# Patient Record
Sex: Male | Born: 1995 | Race: White | Hispanic: No | Marital: Single | Smoking: Former smoker
Health system: Southern US, Community
[De-identification: ages and names within clinical notes are randomized; demographics above are authoritative.]

## PROBLEM LIST (undated history)

## (undated) DIAGNOSIS — F32A Depression, unspecified: Secondary | ICD-10-CM

## (undated) DIAGNOSIS — F319 Bipolar disorder, unspecified: Secondary | ICD-10-CM

## (undated) DIAGNOSIS — F329 Major depressive disorder, single episode, unspecified: Secondary | ICD-10-CM

## (undated) HISTORY — PX: TONSILLECTOMY, ADENOIDECTOMY: SHX5620

## (undated) HISTORY — DX: Depression, unspecified: F32.A

## (undated) HISTORY — DX: Major depressive disorder, single episode, unspecified: F32.9

## (undated) HISTORY — DX: Bipolar disorder, unspecified: F31.9

---
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
## 2016-10-09 ENCOUNTER — Emergency Department
Admission: EM | Admit: 2016-10-09 | Discharge: 2016-10-09 | Disposition: A | Discharge status: Home / Self Care | Payer: 59 | Attending: Emergency Medicine | Admitting: Emergency Medicine

## 2016-10-09 ENCOUNTER — Emergency Department: Payer: 59

## 2016-10-09 DIAGNOSIS — Y9289 Other specified places as the place of occurrence of the external cause: Secondary | ICD-10-CM | POA: Insufficient documentation

## 2016-10-09 DIAGNOSIS — Y999 Unspecified external cause status: Secondary | ICD-10-CM | POA: Diagnosis not present

## 2016-10-09 DIAGNOSIS — Y939 Activity, unspecified: Secondary | ICD-10-CM | POA: Diagnosis not present

## 2016-10-09 DIAGNOSIS — S060X0A Concussion without loss of consciousness, initial encounter: Secondary | ICD-10-CM | POA: Diagnosis not present

## 2016-10-09 DIAGNOSIS — S0990XA Unspecified injury of head, initial encounter: Secondary | ICD-10-CM | POA: Diagnosis present

## 2016-10-09 MED ORDER — IBUPROFEN 800 MG PO TABS
800.0000 mg | ORAL_TABLET | Freq: Once | ORAL | Status: AC
Start: 2016-10-09 — End: 2016-10-09
  Administered 2016-10-09: 800 mg via ORAL
  Filled 2016-10-09: qty 1

## 2016-10-09 NOTE — ED Notes (Signed)
Patient transported to CT 
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
## 2016-10-09 NOTE — ED Notes (Signed)
Pt. Going home with friend 

## 2016-10-13 DIAGNOSIS — F0781 Postconcussional syndrome: Secondary | ICD-10-CM | POA: Insufficient documentation

## 2016-10-13 DIAGNOSIS — G44319 Acute post-traumatic headache, not intractable: Secondary | ICD-10-CM | POA: Insufficient documentation

## 2016-11-08 ENCOUNTER — Ambulatory Visit (INDEPENDENT_AMBULATORY_CARE_PROVIDER_SITE_OTHER): Payer: No Typology Code available for payment source | Admitting: Clinical Neuropsychologist

## 2016-11-08 DIAGNOSIS — S060X1A Concussion with loss of consciousness of 30 minutes or less, initial encounter: Secondary | ICD-10-CM | POA: Insufficient documentation
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
## 2018-03-01 ENCOUNTER — Emergency Department
Admission: EM | Admit: 2018-03-01 | Discharge: 2018-03-02 | Disposition: A | Discharge status: Other Acute Inpatient Hospital | Payer: 59 | Attending: Emergency Medicine | Admitting: Emergency Medicine

## 2018-03-01 DIAGNOSIS — F312 Bipolar disorder, current episode manic severe with psychotic features: Secondary | ICD-10-CM

## 2018-03-01 DIAGNOSIS — F3113 Bipolar disorder, current episode manic without psychotic features, severe: Secondary | ICD-10-CM | POA: Diagnosis not present

## 2018-03-01 DIAGNOSIS — F29 Unspecified psychosis not due to a substance or known physiological condition: Secondary | ICD-10-CM | POA: Diagnosis not present

## 2018-03-01 DIAGNOSIS — F17210 Nicotine dependence, cigarettes, uncomplicated: Secondary | ICD-10-CM | POA: Diagnosis not present

## 2018-03-01 DIAGNOSIS — F901 Attention-deficit hyperactivity disorder, predominantly hyperactive type: Secondary | ICD-10-CM | POA: Insufficient documentation

## 2018-03-01 LAB — COMPREHENSIVE METABOLIC PANEL
ALBUMIN: 4.9 g/dL (ref 3.5–5.0)
ALK PHOS: 76 U/L (ref 38–126)
ALT: 65 U/L — ABNORMAL HIGH (ref 17–63)
AST: 116 U/L — ABNORMAL HIGH (ref 15–41)
Anion gap: 12 (ref 5–15)
BILIRUBIN TOTAL: 0.7 mg/dL (ref 0.3–1.2)
BUN: 11 mg/dL (ref 6–20)
CALCIUM: 9.1 mg/dL (ref 8.9–10.3)
CO2: 23 mmol/L (ref 22–32)
CREATININE: 0.86 mg/dL (ref 0.61–1.24)
Chloride: 103 mmol/L (ref 101–111)
GFR calc non Af Amer: >60 mL/min (ref >60)
GLUCOSE: 120 mg/dL — AB (ref 65–99)
Potassium: 3.9 mmol/L (ref 3.5–5.1)
SODIUM: 138 mmol/L (ref 135–145)
TOTAL PROTEIN: 8.7 g/dL — AB (ref 6.5–8.1)

## 2018-03-01 LAB — CBC
HEMATOCRIT: 46.6 % (ref 40.0–52.0)
Hemoglobin: 15.6 g/dL (ref 13.0–18.0)
MCH: 29.3 pg (ref 26.0–34.0)
MCHC: 33.3 g/dL (ref 32.0–36.0)
MCV: 87.8 fL (ref 80.0–100.0)
Platelets: 326 10*3/uL (ref 150–440)
RBC: 5.31 MIL/uL (ref 4.40–5.90)
RDW: 13.6 % (ref 11.5–14.5)
WBC: 11.1 10*3/uL — ABNORMAL HIGH (ref 3.8–10.6)

## 2018-03-01 LAB — ACETAMINOPHEN LEVEL

## 2018-03-01 LAB — ETHANOL: Alcohol, Ethyl (B): <10 mg/dL (ref <10)

## 2018-03-01 LAB — SALICYLATE LEVEL: Salicylate Lvl: <7 mg/dL (ref 2.8–30.0)

## 2018-03-01 MED ORDER — LORAZEPAM 2 MG/ML IJ SOLN: 2.0000 mg | Freq: Once | INTRAMUSCULAR | Status: DC

## 2018-03-01 MED ORDER — DIPHENHYDRAMINE HCL 50 MG/ML IJ SOLN: 50.0000 mg | Freq: Once | INTRAMUSCULAR | Status: DC

## 2018-03-01 MED ORDER — LORAZEPAM 2 MG/ML IJ SOLN
INTRAMUSCULAR | Status: AC
  Filled 2018-03-01: qty 1

## 2018-03-01 MED ORDER — HALOPERIDOL LACTATE 5 MG/ML IJ SOLN: 5.0000 mg | Freq: Once | INTRAMUSCULAR | Status: DC

## 2018-03-01 MED ORDER — HALOPERIDOL LACTATE 5 MG/ML IJ SOLN
INTRAMUSCULAR | Status: AC
  Filled 2018-03-01: qty 1

## 2018-03-01 MED ORDER — DIPHENHYDRAMINE HCL 50 MG/ML IJ SOLN
INTRAMUSCULAR | Status: AC
  Filled 2018-03-01: qty 1

## 2018-03-01 NOTE — ED Triage Notes (Signed)
Patient states: "I have a patent on a theorem that I would like to file that can prove anything that has ever happened, or will ever happen. Whenever I try to explain this to anyone, I am shuffled to a new person. I have given this patent to my father in [place], but instead I have been ushered around, preventing me from filing this patent."   Patient asking for written documentation that everything that this RN does is legal.

## 2018-03-01 NOTE — ED Notes (Signed)
MD Schaevitz to triage 1 to speak with patient

## 2018-03-01 NOTE — ED Triage Notes (Signed)
Patient reports he hasn't slept from between 1-35 hours.

## 2018-03-01 NOTE — ED Triage Notes (Signed)
Patient states: "4 standard unit measures of alcohol". Patient denies drug use.

## 2018-03-01 NOTE — ED Notes (Signed)
ODS aware of IVC

## 2018-03-01 NOTE — ED Provider Notes (Signed)
[facility] Emergency Department Provider Note  ___________________________________________   First MD Initiated Contact with Patient 03/01/18 2254     (approximate)  I have reviewed the triage vital signs and the nursing notes.   HISTORY  Chief Complaint No chief complaint on file.   HPI Verdell CarmineLachlan Mazzocco is a 22 y.o. male with a history of ADHD, unmedicated, who is presenting to the emergency department today with ill on PD who responding to a call to their crisis center from the patient's father.  Per the patient, he has discovered "a serum of everything."  He says that he try to get a patent on it today and that he thinks that there was a response from people who are after his passion.  He said that several men in black suit stood "equidistant" from each other around him at a bar today.  He also repeatedly says that he thinks he has been "shuffleboarded" when referring to his current situation.  Unclear what this means exactly.  He gave me permission to speak with his father and his father said that he is not normally acting like this.  The father says that he has had multiple text messages from the patient throughout the day talking about his "the arm of everything" and getting a patent on this.  Father says that there is a history of mental illness in the family with a grandfather with bipolar disorder as well as the patient's sister who killed herself 7 years ago.  The father says that the patient refuses to medicate himself for his ADHD.  Patient does admit to having several beers today but otherwise denies any drug use.   History reviewed. No pertinent past medical history.  There are no active problems to display for this patient.   History reviewed. No pertinent surgical history.  Prior to Admission medications   Not on File    Allergies Patient has no known allergies.  No family history on file.  Social History Social History   Tobacco Use    . Smoking status: Current Some Day Smoker  . Smokeless tobacco: Never Used  Substance Use Topics  . Alcohol use: Yes  . Drug use: Not on file    Review of Systems  Constitutional: No fever/chills Eyes: No visual changes. ENT: No sore throat. Cardiovascular: Denies chest pain. Respiratory: Denies shortness of breath. Gastrointestinal: No abdominal pain.  No nausea, no vomiting.  No diarrhea.  No constipation. Genitourinary: Negative for dysuria. Musculoskeletal: Negative for back pain. Skin: Negative for rash. Neurological: Negative for headaches, focal weakness or numbness.   ____________________________________________   PHYSICAL EXAM:  VITAL SIGNS: ED Triage Vitals [03/01/18 2212]  Enc Vitals Group     BP (!) 169/113     Pulse Rate 94     Resp 18     Temp 98.4 F (36.9 C)     Temp Source Oral     SpO2 99 %     Weight 230 lb (104.3 kg)     Height 6' (1.829 m)     Head Circumference      Peak Flow      Pain Score      Pain Loc      Pain Edu?      Excl. in GC?     Constitutional: Alert and oriented.  Patient appears agitated and slightly confrontational.  Standing up and pacing around the room while talking to me.  However, he is redirectable and agrees to  be dressed out into scrubs and to talk to psychiatry to have his blood drawn. Eyes: Conjunctivae are normal.  Head: Atraumatic. Nose: No congestion/rhinnorhea. Mouth/Throat: Mucous membranes are moist.  Neck: No stridor.   Cardiovascular: Normal rate, regular rhythm. Grossly normal heart sounds.   Respiratory: Normal respiratory effort.  No retractions. Lungs CTAB. Gastrointestinal: Soft and nontender. No distention. Musculoskeletal: No lower extremity tenderness nor edema.  No joint effusions. Neurologic:  Normal speech and language. No gross focal neurologic deficits are appreciated. Skin:  Skin is warm, dry and intact. No rash noted. Psychiatric: Delusions of grandeur with tangential  speech.  ____________________________________________   LABS (all labs ordered are listed, but only abnormal results are displayed)  Labs Reviewed  COMPREHENSIVE METABOLIC PANEL - Abnormal; Notable for the following components:      Result Value   Glucose, Bld 120 (*)    Total Protein 8.7 (*)    AST 116 (*)    ALT 65 (*)    All other components within normal limits  CBC - Abnormal; Notable for the following components:   WBC 11.1 (*)    All other components within normal limits  ETHANOL  SALICYLATE LEVEL  ACETAMINOPHEN LEVEL  URINE DRUG SCREEN, QUALITATIVE ([facility] ONLY)  URINALYSIS, COMPLETE ([facility]) WITH MICROSCOPIC   ____________________________________________  EKG   ____________________________________________  RADIOLOGY   ____________________________________________   PROCEDURES  Procedure(s) performed:   Procedures  Critical Care performed:   ____________________________________________   INITIAL IMPRESSION / ASSESSMENT AND PLAN / ED COURSE  Pertinent labs & imaging results that were available during my care of the patient were reviewed by me and considered in my medical decision making (see chart for details).  DDX: Psychosis, mania, schizophrenia, drug-induced psychosis, alcohol intoxication As part of my medical decision making, I reviewed the following data within the electronic MEDICAL RECORD NUMBER No prior visits for review.   Patient placed under involuntary commitment and will be evaluated by psychiatry. ____________________________________________   FINAL CLINICAL IMPRESSION(S) / ED DIAGNOSES  Psychosis    NEW MEDICATIONS STARTED DURING THIS VISIT:  New Prescriptions   No medications on file     Note:  This document was prepared using Dragon voice recognition software and may include unintentional dictation errors.     Myrna Blazer, MD 03/01/18 [phone]

## 2018-03-01 NOTE — ED Notes (Signed)
Patient's father, Lacie Scottsndrew J Farfan: [phone].   Patient does NOT want his medical information shared with his father at this point.

## 2018-03-02 ENCOUNTER — Other Ambulatory Visit: Payer: Self-pay

## 2018-03-02 ENCOUNTER — Inpatient Hospital Stay
Admission: AD | Admit: 2018-03-02 | Discharge: 2018-03-09 | DRG: 885 | Disposition: A | Discharge status: Home / Self Care | Payer: 59 | Attending: Psychiatry | Admitting: Psychiatry

## 2018-03-02 DIAGNOSIS — F17200 Nicotine dependence, unspecified, uncomplicated: Secondary | ICD-10-CM | POA: Diagnosis present

## 2018-03-02 DIAGNOSIS — Z5181 Encounter for therapeutic drug level monitoring: Secondary | ICD-10-CM | POA: Diagnosis not present

## 2018-03-02 DIAGNOSIS — E8881 Metabolic syndrome: Secondary | ICD-10-CM | POA: Diagnosis present

## 2018-03-02 DIAGNOSIS — F3113 Bipolar disorder, current episode manic without psychotic features, severe: Secondary | ICD-10-CM

## 2018-03-02 DIAGNOSIS — F29 Unspecified psychosis not due to a substance or known physiological condition: Secondary | ICD-10-CM | POA: Diagnosis not present

## 2018-03-02 DIAGNOSIS — F312 Bipolar disorder, current episode manic severe with psychotic features: Principal | ICD-10-CM | POA: Diagnosis present

## 2018-03-02 DIAGNOSIS — F1010 Alcohol abuse, uncomplicated: Secondary | ICD-10-CM | POA: Diagnosis present

## 2018-03-02 DIAGNOSIS — F909 Attention-deficit hyperactivity disorder, unspecified type: Secondary | ICD-10-CM | POA: Diagnosis present

## 2018-03-02 DIAGNOSIS — Z818 Family history of other mental and behavioral disorders: Secondary | ICD-10-CM

## 2018-03-02 DIAGNOSIS — G4700 Insomnia, unspecified: Secondary | ICD-10-CM | POA: Diagnosis not present

## 2018-03-02 DIAGNOSIS — Z79899 Other long term (current) drug therapy: Secondary | ICD-10-CM | POA: Diagnosis not present

## 2018-03-02 LAB — URINE DRUG SCREEN, QUALITATIVE (ARMC ONLY)
Amphetamines, Ur Screen: NOT DETECTED
BENZODIAZEPINE, UR SCRN: NOT DETECTED
Barbiturates, Ur Screen: NOT DETECTED
Cannabinoid 50 Ng, Ur ~~LOC~~: NOT DETECTED
Cocaine Metabolite,Ur ~~LOC~~: NOT DETECTED
MDMA (Ecstasy)Ur Screen: NOT DETECTED
METHADONE SCREEN, URINE: NOT DETECTED
Opiate, Ur Screen: NOT DETECTED
Phencyclidine (PCP) Ur S: NOT DETECTED
TRICYCLIC, UR SCREEN: NOT DETECTED

## 2018-03-02 LAB — URINALYSIS, COMPLETE (UACMP) WITH MICROSCOPIC
BILIRUBIN URINE: NEGATIVE
Bacteria, UA: NONE SEEN
Glucose, UA: NEGATIVE mg/dL
Hgb urine dipstick: NEGATIVE
Ketones, ur: 5 mg/dL — AB
LEUKOCYTES UA: NEGATIVE
NITRITE: NEGATIVE
PH: 5 (ref 5.0–8.0)
Protein, ur: NEGATIVE mg/dL
SPECIFIC GRAVITY, URINE: 1.02 (ref 1.005–1.030)

## 2018-03-02 MED ORDER — LITHIUM CARBONATE ER 300 MG PO TBCR
600.0000 mg | EXTENDED_RELEASE_TABLET | Freq: Every day | ORAL | Status: DC
  Administered 2018-03-02: 600 mg via ORAL
  Filled 2018-03-02: qty 2

## 2018-03-02 MED ORDER — LORAZEPAM 2 MG PO TABS
2.0000 mg | ORAL_TABLET | Freq: Four times a day (QID) | ORAL | Status: DC | PRN
  Administered 2018-03-02: 2 mg via ORAL
  Filled 2018-03-02: qty 1

## 2018-03-02 MED ORDER — TEMAZEPAM 15 MG PO CAPS
15.0000 mg | ORAL_CAPSULE | Freq: Every evening | ORAL | Status: DC | PRN
  Administered 2018-03-02 – 2018-03-03 (×2): 15 mg via ORAL
  Filled 2018-03-02 (×2): qty 1

## 2018-03-02 MED ORDER — LORAZEPAM 2 MG PO TABS: 2.0000 mg | ORAL_TABLET | Freq: Four times a day (QID) | ORAL | Status: DC | PRN

## 2018-03-02 MED ORDER — HYDROXYZINE HCL 50 MG PO TABS
50.0000 mg | ORAL_TABLET | Freq: Four times a day (QID) | ORAL | Status: DC | PRN
  Administered 2018-03-05 – 2018-03-07 (×3): 50 mg via ORAL
  Filled 2018-03-02 (×4): qty 1

## 2018-03-02 MED ORDER — LITHIUM CARBONATE ER 300 MG PO TBCR: 600.0000 mg | EXTENDED_RELEASE_TABLET | Freq: Every day | ORAL | Status: DC

## 2018-03-02 MED ORDER — MAGNESIUM HYDROXIDE 400 MG/5ML PO SUSP: 30.0000 mL | Freq: Every day | ORAL | Status: DC | PRN

## 2018-03-02 MED ORDER — ALUM & MAG HYDROXIDE-SIMETH 200-200-20 MG/5ML PO SUSP: 30.0000 mL | ORAL | Status: DC | PRN

## 2018-03-02 MED ORDER — ACETAMINOPHEN 325 MG PO TABS: 650.0000 mg | ORAL_TABLET | Freq: Four times a day (QID) | ORAL | Status: DC | PRN

## 2018-03-02 NOTE — ED Notes (Signed)
Report to include Situation, Background, Assessment, and Recommendations received from Kenisha RN. Patient alert and oriented, warm and dry, in no acute distress. Patient denies SI, HI, AVH and pain. Patient made aware of Q15 minute rounds and security cameras for their safety. Patient instructed to come to me with needs or concerns. 

## 2018-03-02 NOTE — ED Notes (Signed)
Breakfast meal tray provided, as well as toiletries provided, and patient performed ADLs independently without any acute distress noted.

## 2018-03-02 NOTE — Progress Notes (Signed)
[facility] , alert and oriented x4 , responding and participating in assessment procedures. Patient present with disturbed perceptions including dilutions, states he has  discovered how humans can   travel in black holes and back and ask me if I can help him get a patent, he continued by inventing a time travel machine that overcome the earths  Gravity on humans and makes it possible to travel in warm holes. 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 Patient behaviors is incongruent with current situation, thinking is disorganized and abnormal. Skin and body search was done by 2 RNs , Alex/Alexis. No contraband was found. Patient is compliant with his medication, contracted for safety of self and others , denies any suicidal tendencies and H/I , patient complies intermittently with safety rules, 15 minutes safety rounds maintained, no acute distress.

## 2018-03-02 NOTE — BH Assessment (Signed)
[facility] by Dr. Toni Amendlapacs.  Attending Physician will be Dr. Jennet MaduroPucilowska.   Patient has been [facility] to room 323, [facility] PocaPhyllis.   ER staff is aware of the admission:  Annette,ER Sectary   Dr. Alphonzo LemmingsMcShane, ER MD   Everardo PacificKenisha, Patient's Nurse   Mertie ClauseJeanelle, Patient Access

## 2018-03-02 NOTE — ED Notes (Signed)
SOC in progress.  

## 2018-03-02 NOTE — ED Notes (Signed)
Patient indicated he was aware that he mother was driving to hospital, patient wants mother to know about his medical condition and offer hx if needed.

## 2018-03-02 NOTE — ED Notes (Signed)
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

## 2018-03-02 NOTE — ED Notes (Signed)
SOC recommends inpatient. Will be faxing recommendations.

## 2018-03-02 NOTE — ED Notes (Addendum)
Front desk called and indicated mother was here.  Mother indicated patient has had multiple stressors in the past couple months.  Mother left phone #[phone]

## 2018-03-02 NOTE — Tx Team (Signed)
Initial Treatment Plan 03/02/2018 10:39 PM Brandon CarmineLachlan Ivins JYN:[id number]    PATIENT STRESSORS: Health problems Occupational concerns Substance abuse   PATIENT STRENGTHS: Average or above average intelligence Communication skills General fund of knowledge Motivation for treatment/growth Physical Health Supportive family/friends   PATIENT IDENTIFIED PROBLEMS:   Psychosis Drug Induced    Depression/anxiety    ADHD    Dilutions of grandeur       DISCHARGE CRITERIA:  Ability to meet basic life and health needs Adequate post-discharge living arrangements Improved stabilization in mood, thinking, and/or behavior Motivation to continue treatment in a less acute level of care Reduction of life-threatening or endangering symptoms to within safe limits Safe-care adequate arrangements made  PRELIMINARY DISCHARGE PLAN: Attend aftercare/continuing care group Outpatient therapy Participate in family therapy Placement in alternative living arrangements Return to previous living arrangement  PATIENT/FAMILY INVOLVEMENT: This treatment plan has been presented to and reviewed with the patient, Brandon Orr,  The patient  have been given the opportunity to ask questions and make suggestions.  Lelan PonsAlexander  Kongmeng Santoro, RN 03/02/2018, 10:39 PM

## 2018-03-02 NOTE — ED Notes (Signed)
[facility]

## 2018-03-02 NOTE — [facility] Group Notes (Signed)
[facility] Group Notes:  (Nursing/MHT/Case Management/Adjunct)  Date:  03/02/2018  Time:  9:38 PM  Type of Therapy:  Group Therapy  Participation Level:  Did Not Attend   Summary of Progress/Problems:  Mayra NeerJackie L Kamarri Lovvorn 03/02/2018, 9:38 PM

## 2018-03-02 NOTE — ED Notes (Signed)
Report given to SOC 

## 2018-03-02 NOTE — ED Provider Notes (Signed)
-----------------------------------------   7:13 AM on 03/02/2018 -----------------------------------------   Blood pressure (!) 126/93, pulse 94, temperature 98.4 F (36.9 C), temperature source Oral, resp. rate 18, height 6' (1.829 m), weight 104.3 kg (230 lb), SpO2 99 %.  The patient had no acute events since last update.  Calm and cooperative at this time.  Disposition is pending Psychiatry/Behavioral Medicine team recommendations.     Jeanmarie PlantMcShane, Jasman Pfeifle A, MD 03/02/18 [phone]

## 2018-03-02 NOTE — ED Notes (Signed)
Pt. To BHU from ED ambulatory without difficulty, to room  B[facility] room 4. [facility]. Pt. Is alert and oriented, warm and dry in no distress. Pt. Denies SI, HI, and AVH. Pt. Calm and cooperative. Pt. Made aware of security cameras and Q15 minute rounds. Pt. Encouraged to let Nursing staff know of any concerns or needs.

## 2018-03-02 NOTE — Consult Note (Signed)
[place] Psychiatry Consult   Reason for Consult: Consult for this 22 year old man brought over by authorities because of manic behavior Referring Physician: Reita Cliche Patient Identification: Brandon Orr MRN:  [id number] Principal Diagnosis: Bipolar disorder with severe mania ([place]) Diagnosis:   Patient Active Problem List   Diagnosis Date Noted  . Bipolar disorder with severe mania ([place]) [F31.13] 03/02/2018    Total Time spent with patient: 1 hour  Subjective:   Brandon Orr is a 22 y.o. male patient admitted with "yesterday I discovered a formula".  HPI: Patient interviewed.  Chart reviewed.  Spoke with his mother as well who came down and is here in person.  22 year old man brought in because of acute manic behaviors.  Patient says that yesterday he realized that he had discovered a formula that will somehow revolutionized history or science.  His description of it is incoherent and self contradictory.  He is obviously very emotionally agitated and labile.  It sounds like he then got paranoid about it and started acting paranoid and speaking on his psychotic thoughts in public 2 people at a lacrosse game and then at a [employer].  Patient is only partially cooperative with the interview.  Will not detail exactly how much she is been sleeping.  He admits that he had "a 6 pack" to drink yesterday but will not go into much detail about how frequently or how much alcohol he is drinking most of the time.  Denies that he has been using any other drugs.  Talking to his mother there are hints of major life stresses and possibly some emotional lability recently but nothing that had been seen as pathologic by his family.  Patient had gone through an experience last semester through January of battling against a charge of some kind of sexual misconduct.  Also recently broke up with his girlfriend.  Mother does mention that he had seemed angry recently but that it had seemed appropriate given his  stress situation.  Patient is not currently taking any psychiatric medicine.  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  Family lives in [place].  Patient went through a difficult time last semester having been accused of some kind of inappropriate sexual behavior.  According to all parties he was eventually cleared of this but it caused some major problems to his social and schooling development.  Substance abuse history: Patient says that he drinks alcohol and had "a 6 pack" to drink yesterday but will not go into detail about how much he drinks most of the time.  It does not sound like there is ever been concerned about substance use problems in the past  Past Psychiatric History: Patient was diagnosed and treated for ADHD as a child.  Was on stimulant medicine many years ago.  He says he has not taken it in at least many months.  He admits that he has seen mental health providers but is a bit paranoid about it and declines to give me much detail.  He tells me that somebody had prescribed some medicine for him but he is not taking it.  Declines to tell me what it is.  According to the reconciliation there is some evidence that Wellbutrin was prescribed at some point.  Denies any history of suicide attempts.  Risk to Self: Suicidal Ideation: No Suicidal Intent: No Is patient at risk for suicide?: No Suicidal Plan?: No Access  to Means: No What has been your use of drugs/alcohol within the last 12 months?: pt reports drinking a few beers How many times?: 0 Other Self Harm Risks: none noted Triggers for Past Attempts: Other (Comment) Intentional Self Injurious Behavior: None Risk to Others: Homicidal Ideation: No Thoughts of Harm to Others: No Current Homicidal Intent: No Current Homicidal Plan: No Access to Homicidal Means: No Identified Victim: n/a History of harm to others?:  No Assessment of Violence: None Noted Violent Behavior Description: denies Does patient have access to weapons?: No Criminal Charges Pending?: No Does patient have a court date: No Prior Inpatient Therapy: Prior Inpatient Therapy: No Prior Outpatient Therapy: Prior Outpatient Therapy: No Does patient have an ACCT team?: No Does patient have Intensive In-House Services?  : No Does patient have [facility] services? : No Does patient have P4CC services?: No  Past Medical History: History reviewed. No pertinent past medical history. History reviewed. No pertinent surgical history. Family History: No family history on file. Family Psychiatric  History: Significant family history.  Patient's older sister killed herself several years ago.  Underlying pathology unknown.  He had a biological grandfather who suffered from bipolar disorder and was on lithium for years. Social History:  Social History   Substance and Sexual Activity  Alcohol Use Yes     Social History   Substance and Sexual Activity  Drug Use Not on file    Social History   Socioeconomic History  . Marital status: Single    Spouse name: None  . Number of children: None  . Years of education: None  . Highest education level: None  Social Needs  . Financial resource strain: None  . Food insecurity - worry: None  . Food insecurity - inability: None  . Transportation needs - medical: None  . Transportation needs - non-medical: None  Occupational History  . None  Tobacco Use  . Smoking status: Current Some Day Smoker  . Smokeless tobacco: Never Used  Substance and Sexual Activity  . Alcohol use: Yes  . Drug use: None  . Sexual activity: None  Other Topics Concern  . None  Social History Narrative  . None   Additional Social History:    Allergies:  No Known Allergies  Labs:  Results for orders placed or performed during the hospital encounter of 03/01/18 (from the past 48 hour(s))  Comprehensive metabolic  panel     Status: Abnormal   Collection Time: 03/01/18 10:36 PM  Result Value Ref Range   Sodium 138 135 - 145 mmol/L   Potassium 3.9 3.5 - 5.1 mmol/L   Chloride 103 101 - 111 mmol/L   CO2 23 22 - 32 mmol/L   Glucose, Bld 120 (H) 65 - 99 mg/dL   BUN 11 6 - 20 mg/dL   Creatinine, Ser 0.86 0.61 - 1.24 mg/dL   Calcium 9.1 8.9 - 10.3 mg/dL   Total Protein 8.7 (H) 6.5 - 8.1 g/dL   Albumin 4.9 3.5 - 5.0 g/dL   AST 116 (H) 15 - 41 U/L   ALT 65 (H) 17 - 63 U/L   Alkaline Phosphatase 76 38 - 126 U/L   Total Bilirubin 0.7 0.3 - 1.2 mg/dL   GFR calc non Af Amer >60 >60 mL/min   GFR calc Af Amer >60 >60 mL/min    Comment: (NOTE) The eGFR has been calculated using the CKD EPI equation. This calculation has not been validated in all clinical situations. eGFR's persistently <60 mL/min signify possible  Chronic Kidney Disease.    Anion gap 12 5 - 15    Comment: Performed at [facility], [address] [place], [place] [address]  Ethanol     Status: None   Collection Time: 03/01/18 10:36 PM  Result Value Ref Range   Alcohol, Ethyl (B) <10 <10 mg/dL    Comment:        LOWEST DETECTABLE LIMIT FOR SERUM ALCOHOL IS 10 mg/dL FOR MEDICAL PURPOSES ONLY Performed at [facility], [address] [place], [place] [address]   Salicylate level     Status: None   Collection Time: 03/01/18 10:36 PM  Result Value Ref Range   Salicylate Lvl <6.2 2.8 - 30.0 mg/dL    Comment: Performed at [facility], [address] [place], [place] [address]  Acetaminophen level     Status: Abnormal   Collection Time: 03/01/18 10:36 PM  Result Value Ref Range   Acetaminophen (Tylenol), Serum <10 (L) 10 - 30 ug/mL    Comment:        THERAPEUTIC CONCENTRATIONS VARY SIGNIFICANTLY. A RANGE OF 10-30 ug/mL MAY BE AN EFFECTIVE CONCENTRATION FOR MANY PATIENTS. HOWEVER, SOME ARE BEST TREATED AT CONCENTRATIONS OUTSIDE THIS RANGE. ACETAMINOPHEN CONCENTRATIONS >150 ug/mL AT 4 HOURS  AFTER INGESTION AND >50 ug/mL AT 12 HOURS AFTER INGESTION ARE OFTEN ASSOCIATED WITH TOXIC REACTIONS. Performed at [facility], [address] [place], [place] [address]   cbc     Status: Abnormal   Collection Time: 03/01/18 10:36 PM  Result Value Ref Range   WBC 11.1 (H) 3.8 - 10.6 K/uL   RBC 5.31 4.40 - 5.90 MIL/uL   Hemoglobin 15.6 13.0 - 18.0 g/dL   HCT 46.6 40.0 - 52.0 %   MCV 87.8 80.0 - 100.0 fL   MCH 29.3 26.0 - 34.0 pg   MCHC 33.3 32.0 - 36.0 g/dL   RDW 13.6 11.5 - 14.5 %   Platelets 326 150 - 440 K/uL    Comment: Performed at [facility], [address] [place], [place] [address]    Current Facility-Administered Medications  Medication Dose Route Frequency Provider Last Rate Last Dose  . diphenhydrAMINE (BENADRYL) injection 50 mg  50 mg Intramuscular Once Orbie Pyo, MD      . haloperidol lactate (HALDOL) injection 5 mg  5 mg Intramuscular Once Orbie Pyo, MD      . lithium carbonate (LITHOBID) CR tablet 600 mg  600 mg Oral QHS Ceciley Buist T, MD      . LORazepam (ATIVAN) injection 2 mg  2 mg Intramuscular Once Orbie Pyo, MD      . LORazepam (ATIVAN) tablet 2 mg  2 mg Oral Q6H PRN Jaret Coppedge, Madie Reno, MD       Current Outpatient Medications  Medication Sig Dispense Refill  . buPROPion (WELLBUTRIN XL) 150 MG 24 hr tablet Take 150 mg by mouth daily.      Musculoskeletal: Strength & Muscle Tone: within normal limits Gait & Station: normal Patient leans: N/A  Psychiatric Specialty Exam: Physical Exam  Nursing note and vitals reviewed. Constitutional: He appears well-developed and well-nourished.  HENT:  Head: Normocephalic and atraumatic.  Eyes: Conjunctivae are normal. Pupils are equal, round, and reactive to light.  Neck: Normal range of motion.  Cardiovascular: Regular rhythm and normal heart sounds.  Respiratory: Effort normal. No respiratory distress.  GI: Soft.  Musculoskeletal: Normal range  of motion.  Neurological: He is alert.  Skin: Skin is warm  and dry.  Psychiatric: His affect is labile and inappropriate. His speech is rapid and/or pressured and tangential. He is agitated and hyperactive. He is not aggressive and not combative. Thought content is paranoid and delusional. Cognition and memory are normal. He expresses impulsivity and inappropriate judgment.    Review of Systems  Constitutional: Negative.   HENT: Negative.   Eyes: Negative.   Respiratory: Negative.   Cardiovascular: Negative.   Gastrointestinal: Negative.   Musculoskeletal: Negative.   Skin: Negative.   Neurological: Negative.   Psychiatric/Behavioral: Negative for depression, hallucinations, memory loss, substance abuse and suicidal ideas. The patient is nervous/anxious and has insomnia.     Blood pressure (!) 143/85, pulse 71, temperature 97.7 F (36.5 C), temperature source Oral, resp. rate 18, height 6' (1.829 m), weight 104.3 kg (230 lb), SpO2 100 %.Body mass index is 31.19 kg/m.  General Appearance: Casual  Eye Contact:  Good  Speech:  Pressured  Volume:  Increased  Mood:  Anxious and Irritable  Affect:  Inappropriate and Labile  Thought Process:  Disorganized  Orientation:  Full (Time, Place, and Person)  Thought Content:  Illogical, Delusions, Ideas of Reference:   Paranoia and Paranoid Ideation  Suicidal Thoughts:  No  Homicidal Thoughts:  No  Memory:  Immediate;   Fair Recent;   Fair Remote;   Fair  Judgement:  Impaired  Insight:  Shallow  Psychomotor Activity:  Increased and Restlessness  Concentration:  Concentration: Poor  Recall:  [place] of Knowledge:  Good  Language:  Good  Akathisia:  No  Handed:  Right  AIMS (if indicated):     Assets:  Agricultural consultant Housing Physical Health Social Support  ADL's:  Impaired  Cognition:  Impaired,  Mild  Sleep:        Treatment Plan Summary: Daily contact with patient to assess and evaluate  symptoms and progress in treatment, Medication management and Plan 22 year old man who presents with symptoms very typical of mania.  Pressured speech.  Racing thoughts.  Disorganized flight of ideas.  Grandiose delusions recent paranoia.  Patient's insight is minimal at best.  Patient has a significant family history of bipolar disorder.  No evidence to suggest substance-induced illness.  Patient is at high risk of dangerous behavior and needs hospitalization for stability.  Situation explained to the patient and to his mother.  I recommend starting lithium carbonate because of how typical his symptoms are of bipolar disorder.  There will also be orders for as needed medications for agitation and sleep.  Full set of labs to be done.  He is reviewed with emergency room physician and TTS.  Disposition: Recommend psychiatric Inpatient admission when medically cleared. Supportive therapy provided about ongoing stressors.  Alethia Berthold, MD 03/02/2018 12:08 PM

## 2018-03-02 NOTE — ED Notes (Signed)
[facility]  SEEN  BY  DR  CLAPACS  PENDING  PLACEMENT 

## 2018-03-02 NOTE — ED Notes (Signed)
SOC complete.  
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
## 2018-03-03 ENCOUNTER — Encounter: Payer: Self-pay | Admitting: Psychiatry

## 2018-03-03 DIAGNOSIS — F1010 Alcohol abuse, uncomplicated: Secondary | ICD-10-CM | POA: Diagnosis present

## 2018-03-03 DIAGNOSIS — F17200 Nicotine dependence, unspecified, uncomplicated: Secondary | ICD-10-CM | POA: Diagnosis present

## 2018-03-03 DIAGNOSIS — F909 Attention-deficit hyperactivity disorder, unspecified type: Secondary | ICD-10-CM | POA: Diagnosis present

## 2018-03-03 DIAGNOSIS — F312 Bipolar disorder, current episode manic severe with psychotic features: Principal | ICD-10-CM

## 2018-03-03 LAB — LIPID PANEL
CHOLESTEROL: 161 mg/dL (ref 0–200)
HDL: 65 mg/dL (ref >40)
LDL Cholesterol: 78 mg/dL (ref 0–99)
Total CHOL/HDL Ratio: 2.5 RATIO
Triglycerides: 89 mg/dL (ref <150)
VLDL: 18 mg/dL (ref 0–40)

## 2018-03-03 LAB — TSH: TSH: 1.703 u[IU]/mL (ref 0.350–4.500)

## 2018-03-03 LAB — HEMOGLOBIN A1C
Hgb A1c MFr Bld: 4.7 % — ABNORMAL LOW (ref 4.8–5.6)
MEAN PLASMA GLUCOSE: 88.19 mg/dL

## 2018-03-03 MED ORDER — LITHIUM CARBONATE ER 300 MG PO TBCR
300.0000 mg | EXTENDED_RELEASE_TABLET | Freq: Three times a day (TID) | ORAL | Status: DC
  Administered 2018-03-03 – 2018-03-06 (×10): 300 mg via ORAL
  Filled 2018-03-03 (×10): qty 1

## 2018-03-03 MED ORDER — OLANZAPINE 5 MG PO TBDP
5.0000 mg | ORAL_TABLET | Freq: Two times a day (BID) | ORAL | Status: DC
  Administered 2018-03-03: 5 mg via ORAL
  Filled 2018-03-03: qty 1

## 2018-03-03 NOTE — Tx Team (Signed)
Interdisciplinary Treatment and Diagnostic Plan Update  03/03/2018 Time of Session: 2:30 PM Adley Mazurowski MRN: [id number]  Principal Diagnosis: Bipolar I disorder, most recent episode manic, severe with psychotic features (HCC)  Secondary Diagnoses: Principal Problem:   Bipolar I disorder, most recent episode manic, severe with psychotic features (HCC) Active Problems:   Tobacco use disorder   Alcohol use disorder, mild, abuse   Attention deficit hyperactivity disorder (ADHD)   Current Medications:  Current Facility-Administered Medications  Medication Dose Route Frequency Provider Last Rate Last Dose  . acetaminophen (TYLENOL) tablet 650 mg  650 mg Oral Q6H PRN Clapacs, John T, MD      . alum & mag hydroxide-simeth (MAALOX/MYLANTA) 200-200-20 MG/5ML suspension 30 mL  30 mL Oral Q4H PRN Clapacs, John T, MD      . hydrOXYzine (ATARAX/VISTARIL) tablet 50 mg  50 mg Oral Q6H PRN Clapacs, John T, MD      . lithium carbonate (LITHOBID) CR tablet 300 mg  300 mg Oral TID Pucilowska, Jolanta B, MD   300 mg at 03/03/18 0826  . magnesium hydroxide (MILK OF MAGNESIA) suspension 30 mL  30 mL Oral Daily PRN Clapacs, John T, MD      . temazepam (RESTORIL) capsule 15 mg  15 mg Oral QHS PRN Clapacs, Jackquline Denmark, MD   15 mg at 03/02/18 2302   PTA Medications: Medications Prior to Admission  Medication Sig Dispense Refill Last Dose  . buPROPion (WELLBUTRIN XL) 150 MG 24 hr tablet Take 150 mg by mouth daily.       Patient Stressors: Health problems Occupational concerns Substance abuse  Patient Strengths: Average or above average intelligence Communication skills General fund of knowledge Motivation for treatment/growth Physical Health Supportive family/friends  Treatment Modalities: Medication Management, Group therapy, Case management,  1 to 1 session with clinician, Psychoeducation, Recreational therapy.   Physician Treatment Plan for Primary Diagnosis: Bipolar I disorder, most recent episode  manic, severe with psychotic features (HCC) Long Term Goal(s): Improvement in symptoms so as ready for discharge Improvement in symptoms so as ready for discharge   Short Term Goals: Ability to identify changes in lifestyle to reduce recurrence of condition will improve Ability to verbalize feelings will improve Ability to disclose and discuss suicidal ideas Ability to demonstrate self-control will improve Ability to identify and develop effective coping behaviors will improve Ability to maintain clinical measurements within normal limits will improve Compliance with prescribed medications will improve Ability to identify triggers associated with substance abuse/mental health issues will improve Ability to identify changes in lifestyle to reduce recurrence of condition will improve Ability to demonstrate self-control will improve Ability to identify triggers associated with substance abuse/mental health issues will improve  Medication Management: Evaluate patient's response, side effects, and tolerance of medication regimen.  Therapeutic Interventions: 1 to 1 sessions, Unit Group sessions and Medication administration.  Evaluation of Outcomes: Progressing  Physician Treatment Plan for Secondary Diagnosis: Principal Problem:   Bipolar I disorder, most recent episode manic, severe with psychotic features (HCC) Active Problems:   Tobacco use disorder   Alcohol use disorder, mild, abuse   Attention deficit hyperactivity disorder (ADHD)  Long Term Goal(s): Improvement in symptoms so as ready for discharge Improvement in symptoms so as ready for discharge   Short Term Goals: Ability to identify changes in lifestyle to reduce recurrence of condition will improve Ability to verbalize feelings will improve Ability to disclose and discuss suicidal ideas Ability to demonstrate self-control will improve Ability to identify and develop effective  coping behaviors will improve Ability to  maintain clinical measurements within normal limits will improve Compliance with prescribed medications will improve Ability to identify triggers associated with substance abuse/mental health issues will improve Ability to identify changes in lifestyle to reduce recurrence of condition will improve Ability to demonstrate self-control will improve Ability to identify triggers associated with substance abuse/mental health issues will improve     Medication Management: Evaluate patient's response, side effects, and tolerance of medication regimen.  Therapeutic Interventions: 1 to 1 sessions, Unit Group sessions and Medication administration.  Evaluation of Outcomes: Progressing   RN Treatment Plan for Primary Diagnosis: Bipolar I disorder, most recent episode manic, severe with psychotic features (HCC) Long Term Goal(s): Knowledge of disease and therapeutic regimen to maintain health will improve  Short Term Goals: Ability to verbalize frustration and anger appropriately will improve, Ability to participate in decision making will improve and Compliance with prescribed medications will improve  Medication Management: RN will administer medications as ordered by provider, will assess and evaluate patient's response and provide education to patient for prescribed medication. RN will report any adverse and/or side effects to prescribing provider.  Therapeutic Interventions: 1 on 1 counseling sessions, Psychoeducation, Medication administration, Evaluate responses to treatment, Monitor vital signs and CBGs as ordered, Perform/monitor CIWA, COWS, AIMS and Fall Risk screenings as ordered, Perform wound care treatments as ordered.  Evaluation of Outcomes: Progressing   LCSW Treatment Plan for Primary Diagnosis: Bipolar I disorder, most recent episode manic, severe with psychotic features (HCC) Long Term Goal(s): Safe transition to appropriate next level of care at discharge, Engage patient in  therapeutic group addressing interpersonal concerns.  Short Term Goals: Engage patient in aftercare planning with referrals and resources and Increase skills for wellness and recovery  Therapeutic Interventions: Assess for all discharge needs, 1 to 1 time with Social worker, Explore available resources and support systems, Assess for adequacy in community support network, Educate family and significant other(s) on suicide prevention, Complete Psychosocial Assessment, Interpersonal group therapy.  Evaluation of Outcomes: Progressing   Progress in Treatment: Attending groups: Yes. Participating in groups: Yes. Taking medication as prescribed: Yes. Toleration medication: Yes. Family/Significant other contact made: No, will contact:  CSW will contact pt's father Kizzie Furnishndrew Tripodi per his request. Patient understands diagnosis: Yes. Discussing patient identified problems/goals with staff: Yes. Medical problems stabilized or resolved: Yes. Denies suicidal/homicidal ideation: Yes. Issues/concerns per patient self-inventory: No. Other: n/a  New problem(s) identified: No, Describe:  No new problems identified.  New Short Term/Long Term Goal(s): Pt shared that his goal is "get out of here and continue with my life"  Discharge Plan or Barriers: No barriers identified.  Pt will discharge back to college and his dormitory. He will receive follow-up outpatient psychiatric services to include medication management.  Reason for Continuation of Hospitalization: Delusions  Medication stabilization  Estimated Length of Stay: 3-5 days  Attendees: Patient: Verdell CarmineLachlan Tipping 03/03/2018 4:13 PM  Physician: Kristine LineaJolanta Pucilowska, MD 03/03/2018 4:13 PM  Nursing:  03/03/2018 4:13 PM  RN Care Manager: 03/03/2018 4:13 PM  Social Worker: Huey RomansSonya Estefani Bateson, LCSW 03/03/2018 4:13 PM  Recreational Therapist:  03/03/2018 4:13 PM  Other: Johny Shearsassandra Jarrett, LCSWA 03/03/2018 4:13 PM  Other:  03/03/2018 4:13 PM  Other: 03/03/2018 4:13 PM     Scribe for Treatment Team: Alease FrameSonya S Decorey Wahlert, LCSW 03/03/2018 4:13 PM
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
## 2018-03-03 NOTE — BHH Suicide Risk Assessment (Signed)
[facility] Admission Suicide Risk Assessment   Nursing information obtained from:    Demographic factors:    Current Mental Status:    Loss Factors:    Historical Factors:    Risk Reduction Factors:     Total Time spent with patient: 1 hour Principal Problem: Bipolar I disorder, most recent episode manic, severe with psychotic features [facility]) Diagnosis:   Patient Active Problem List   Diagnosis Date Noted  . Bipolar I disorder, most recent episode manic, severe with psychotic features (HCC) [F31.2] 03/02/2018    Priority: High  . Tobacco use disorder [F17.200] 03/03/2018  . Alcohol use disorder, mild, abuse [F10.10] 03/03/2018  . Attention deficit hyperactivity disorder (ADHD) [F90.9] 03/03/2018   Subjective Data: psychotic break.  Continued Clinical Symptoms:  Alcohol Use Disorder Identification Test Final Score (AUDIT): 7 The "Alcohol Use Disorders Identification Test", Guidelines for Use in Primary Care, Second Edition.  World Science writer [facility]). Score between 0-7:  no or low risk or alcohol related problems. Score between 8-15:  moderate risk of alcohol related problems. Score between 16-19:  high risk of alcohol related problems. Score 20 or above:  warrants further diagnostic evaluation for alcohol dependence and treatment.   CLINICAL FACTORS:   Bipolar Disorder:   Mixed State Alcohol/Substance Abuse/Dependencies Currently Psychotic   Musculoskeletal: Strength & Muscle Tone: within normal limits Gait & Station: normal Patient leans: N/A  Psychiatric Specialty Exam: Physical Exam  Nursing note and vitals reviewed. Psychiatric: His speech is normal and behavior is normal. His affect is angry. Thought content is paranoid and delusional. Cognition and memory are normal. He expresses impulsivity.    Review of Systems  Neurological: Negative.   Psychiatric/Behavioral: Positive for substance abuse. The patient has insomnia.   All other systems reviewed and are  negative.   Blood pressure 137/86, pulse 94, temperature 98.2 F (36.8 C), temperature source Oral, resp. rate 18, height 6' (1.829 m), weight 107.5 kg (237 lb), SpO2 100 %.Body mass index is 32.14 kg/m.  General Appearance: Casual  Eye Contact:  Good  Speech:  Clear and Coherent  Volume:  Normal  Mood:  Dysphoric and Irritable  Affect:  Congruent  Thought Process:  Goal Directed and Descriptions of Associations: Intact  Orientation:  Full (Time, Place, and Person)  Thought Content:  Delusions and Paranoid Ideation  Suicidal Thoughts:  No  Homicidal Thoughts:  No  Memory:  Immediate;   Fair Recent;   Fair Remote;   Fair  Judgement:  Poor  Insight:  Lacking  Psychomotor Activity:  Normal  Concentration:  Concentration: Fair and Attention Span: Fair  Recall:  [employer] of Knowledge:  Fair  Language:  Fair  Akathisia:  No  Handed:  Right  AIMS (if indicated):     Assets:  Communication Skills Desire for Improvement Financial Resources/Insurance Housing Physical Health Resilience Social Support Transportation Vocational/Educational  ADL's:  Intact  Cognition:  WNL  Sleep:  Number of Hours: 5      COGNITIVE FEATURES THAT CONTRIBUTE TO RISK:  None    SUICIDE RISK:   Mild:  Suicidal ideation of limited frequency, intensity, duration, and specificity.  There are no identifiable plans, no associated intent, mild dysphoria and related symptoms, good self-control (both objective and subjective assessment), few other risk factors, and identifiable protective factors, including available and accessible social support.  PLAN OF CARE: hospital admission, medication management, substance abuse counseling, discharge planning.  [name] is a 22 year old male with a history of ADHD admitted  in a manic psychotic episode.  #Mania -continue Lithium 300 mg TID -patient refuses any other medications -start Restoril 15 mg nightly PRN  #Labs, metabolic syndrome monitoring -lipid  panel, TSH and HgbA1C are pending -EKG, QTc 430  #Disposition -discharged with the mother -follow up with a local provider  I certify that inpatient services furnished can reasonably be expected to improve the patient's condition.   Kristine LineaJolanta Corydon Schweiss, MD 03/03/2018, 2:39 PM

## 2018-03-03 NOTE — H&P (Addendum)
Psychiatric Admission Assessment Adult  Patient Identification: Brandon Orr MRN:  [id number] Date of Evaluation:  03/03/2018 Chief Complaint:  Bipolar Principal Diagnosis: Bipolar I disorder, most recent episode manic, severe with psychotic features [facility]) Diagnosis:   Patient Active Problem List   Diagnosis Date Noted  . Bipolar I disorder, most recent episode manic, severe with psychotic features ([place]) [F31.2] 03/02/2018    Priority: High  . Tobacco use disorder [F17.200] 03/03/2018  . Alcohol use disorder, mild, abuse [F10.10] 03/03/2018  . Attention deficit hyperactivity disorder (ADHD) [F90.9] 03/03/2018   History of Present Illness:   Brandon Orr is a 22 year old male with a history of ADHD and depression.  "I discovered a formula."  History of present illness. Information was obtained from the patient and the chart. The patient was brought to the ER by [place] police for disorganized behavior and paranoid delusional thinking. On the day of admission, he had sudden illumination in calculus class. He felt that he discovered a formula that would allow him to travel in time and space and through the black holes. He initially wanted to share it with everybody but later became paranoid that people were trying to steal it from him. He has not been able to sleep, he was talking too much, his thoughts were racing. He went to a bar to have a beer to quiet his mind. The patient has absolutely no insight into his problems. He is elated. 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 Apparently a week or so ago he had an altercation with his physics professor and just recently broke up with his girlfriend. He denies anxiety. He reports multiple bouts of depression punctuated with times of increased energy that he fights with exercise and yoga. He drinks alcohol occasionally. He does not use drugs.  Met with the mother who came from [place]. Very supportive in favor of Lithium.  Past  psychiatric history. Tested and diagnosed with ADHD. Did not like to take stimulants or Wellbutrin. Never hospitalized. Reports suicidal thoughts in 2013 after his sister committed suicide.  Family psychiatric history. Grandfather with bipolar, sister committed suicide.  Social history. Sophomore at [employer] originally from [place].   Total Time spent with patient: 1 hour  Is the patient at risk to self? Yes.    Has the patient been a risk to self in the past 6 months? No.  Has the patient been a risk to self within the distant past? No.  Is the patient a risk to others? No.  Has the patient been a risk to others in the past 6 months? No.  Has the patient been a risk to others within the distant past? No.   Prior Inpatient Therapy:   Prior Outpatient Therapy:    Alcohol Screening: 1. How often do you have a drink containing alcohol?: 2 to 4 times a month 2. How many drinks containing alcohol do you have on a typical day when you are drinking?: 3 or 4 3. How often do you have six or more drinks on one occasion?: Never AUDIT-C Score: 3 4. How often during the last year have you found that you were not able to stop drinking once you had started?: Less than monthly 5. How often during the last year have you failed to do what was normally expected from you becasue of drinking?: Less than monthly 7. How often during the last year have you had a feeling of guilt of remorse after drinking?: Less than monthly  8. How often during the last year have you been unable to remember what happened the night before because you had been drinking?: Less than monthly 9. Have you or someone else been injured as a result of your drinking?: No 10. Has a relative or friend or a doctor or another health worker been concerned about your drinking or suggested you cut down?: No Alcohol Use Disorder Identification Test Final Score (AUDIT): 7 Intervention/Follow-up: Alcohol Education Substance Abuse History in  the last 12 months:  Yes.   Consequences of Substance Abuse: Negative Previous Psychotropic Medications: Yes  Psychological Evaluations: No  Past Medical History: History reviewed. No pertinent past medical history. History reviewed. No pertinent surgical history. Family History: History reviewed. No pertinent family history.  Tobacco Screening: Have you used any form of tobacco in the last 30 days? (Cigarettes, Smokeless Tobacco, Cigars, and/or Pipes): Yes Tobacco use, Select all that apply: 4 or less cigarettes per day Are you interested in Tobacco Cessation Medications?: No, patient refused Counseled patient on smoking cessation including recognizing danger situations, developing coping skills and basic information about quitting provided: Refused/Declined practical counseling Social History:  Social History   Substance and Sexual Activity  Alcohol Use Yes     Social History   Substance and Sexual Activity  Drug Use Not on file    Additional Social History:                           Allergies:  Not on File Lab Results:  Results for orders placed or performed during the hospital encounter of 03/02/18 (from the past 48 hour(s))  Hemoglobin A1c     Status: Abnormal   Collection Time: 03/03/18  9:31 AM  Result Value Ref Range   Hgb A1c MFr Bld 4.7 (L) 4.8 - 5.6 %    Comment: (NOTE) Pre diabetes:          5.7%-6.4% Diabetes:              >6.4% Glycemic control for   <7.0% adults with diabetes    Mean Plasma Glucose 88.19 mg/dL    Comment: Performed at [facility] [address], [place], [place] [address]  Lipid panel     Status: None   Collection Time: 03/03/18  9:31 AM  Result Value Ref Range   Cholesterol 161 0 - 200 mg/dL   Triglycerides 89 <150 mg/dL   HDL 65 >40 mg/dL   Total CHOL/HDL Ratio 2.5 RATIO   VLDL 18 0 - 40 mg/dL   LDL Cholesterol 78 0 - 99 mg/dL    Comment:        Total Cholesterol/HDL:CHD Risk Coronary Heart Disease Risk Table                      Men   Women  1/2 Average Risk   3.4   3.3  Average Risk       5.0   4.4  2 X Average Risk   9.6   7.1  3 X Average Risk  23.4   11.0        Use the calculated Patient Ratio above and the CHD Risk Table to determine the patient's CHD Risk.        ATP III CLASSIFICATION (LDL):  <100     mg/dL   Optimal  100-129  mg/dL   Near or Above  Optimal  130-159  mg/dL   Borderline  160-189  mg/dL   High  >190     mg/dL   Very High Performed at [facility], [address] [place], [place] [address]   TSH     Status: None   Collection Time: 03/03/18  9:31 AM  Result Value Ref Range   TSH 1.703 0.350 - 4.500 uIU/mL    Comment: Performed by a 3rd Generation assay with a functional sensitivity of <=0.01 uIU/mL. Performed at [facility], [address] [place], [place] [address]     Blood Alcohol level:  Lab Results  Component Value Date   [facility] <10 99/37/1696    Metabolic Disorder Labs:  Lab Results  Component Value Date   HGBA1C 4.7 (L) 03/03/2018   MPG 88.19 03/03/2018   No results found for: PROLACTIN Lab Results  Component Value Date   CHOL 161 03/03/2018   TRIG 89 03/03/2018   HDL 65 03/03/2018   CHOLHDL 2.5 03/03/2018   VLDL 18 03/03/2018   LDLCALC 78 03/03/2018    Current Medications: Current Facility-Administered Medications  Medication Dose Route Frequency Provider Last Rate Last Dose  . acetaminophen (TYLENOL) tablet 650 mg  650 mg Oral Q6H PRN Clapacs, John T, MD      . alum & mag hydroxide-simeth (MAALOX/MYLANTA) 200-200-20 MG/5ML suspension 30 mL  30 mL Oral Q4H PRN Clapacs, John T, MD      . hydrOXYzine (ATARAX/VISTARIL) tablet 50 mg  50 mg Oral Q6H PRN Clapacs, John T, MD      . lithium carbonate (LITHOBID) CR tablet 300 mg  300 mg Oral TID Pucilowska, Jolanta B, MD   300 mg at 03/03/18 0826  . magnesium hydroxide (MILK OF MAGNESIA) suspension 30 mL  30 mL Oral Daily PRN Clapacs, John T, MD      .  temazepam (RESTORIL) capsule 15 mg  15 mg Oral QHS PRN Clapacs, Madie Reno, MD   15 mg at 03/02/18 2302   PTA Medications: Medications Prior to Admission  Medication Sig Dispense Refill Last Dose  . buPROPion (WELLBUTRIN XL) 150 MG 24 hr tablet Take 150 mg by mouth daily.       Musculoskeletal: Strength & Muscle Tone: within normal limits Gait & Station: normal Patient leans: N/A  Psychiatric Specialty Exam: Physical Exam  Nursing note and vitals reviewed. Constitutional: He is oriented to person, place, and time. He appears well-developed and well-nourished.  HENT:  Head: Normocephalic and atraumatic.  Eyes: Conjunctivae and EOM are normal. Pupils are equal, round, and reactive to light.  Neck: Normal range of motion. Neck supple.  Cardiovascular: Normal rate, regular rhythm and normal heart sounds.  Respiratory: Effort normal and breath sounds normal.  GI: Soft. Bowel sounds are normal.  Musculoskeletal: Normal range of motion.  Neurological: He is alert and oriented to person, place, and time.  Skin: Skin is warm and dry.    Review of Systems  Neurological: Negative.   Psychiatric/Behavioral: Positive for substance abuse. The patient has insomnia.   All other systems reviewed and are negative.   Blood pressure 137/86, pulse 94, temperature 98.2 F (36.8 C), temperature source Oral, resp. rate 18, height 6' (1.829 m), weight 107.5 kg (237 lb), SpO2 100 %.Body mass index is 32.14 kg/m.  See SRA  Sleep:  Number of Hours: 5    Treatment Plan Summary: Daily contact with patient to assess and evaluate symptoms and progress in treatment and Medication management   Brandon Orr is a 22 year old male with a history of ADHD admitted in a manic psychotic episode.  #Mania -continue Lithium 300 mg TID -patient refuses any other medications -start Restoril 15 mg nightly PRN  #Labs, metabolic syndrome  monitoring -lipid panel, TSH and HgbA1C are unremarkable -EKG, QTc 430  #Disposition -discharged with the mother -follow up with a local provider  Observation Level/Precautions:  15 minute checks  Laboratory:  CBC Chemistry Profile UDS UA  Psychotherapy:    Medications:    Consultations:    Discharge Concerns:    Estimated LOS:  Other:     Physician Treatment Plan for Primary Diagnosis: Bipolar I disorder, most recent episode manic, severe with psychotic features ([place]) Long Term Goal(s): Improvement in symptoms so as ready for discharge  Short Term Goals: Ability to identify changes in lifestyle to reduce recurrence of condition will improve, Ability to verbalize feelings will improve, Ability to disclose and discuss suicidal ideas, Ability to demonstrate self-control will improve, Ability to identify and develop effective coping behaviors will improve, Ability to maintain clinical measurements within normal limits will improve, Compliance with prescribed medications will improve and Ability to identify triggers associated with substance abuse/mental health issues will improve  Physician Treatment Plan for Secondary Diagnosis: Principal Problem:   Bipolar I disorder, most recent episode manic, severe with psychotic features ([place]) Active Problems:   Tobacco use disorder   Alcohol use disorder, mild, abuse   Attention deficit hyperactivity disorder (ADHD)  Long Term Goal(s): Improvement in symptoms so as ready for discharge  Short Term Goals: Ability to identify changes in lifestyle to reduce recurrence of condition will improve, Ability to demonstrate self-control will improve and Ability to identify triggers associated with substance abuse/mental health issues will improve  I certify that inpatient services furnished can reasonably be expected to improve the patient's condition.    Orson Slick, MD 3/15/20192:43 PM

## 2018-03-03 NOTE — BHH Group Notes (Signed)
LCSW Group Therapy Note  03/03/2018 1:00 pm  Type of Therapy and Topic:  Group Therapy:  Feelings around Relapse and Recovery  Participation Level:  Did Not Attend   Description of Group:    Patients in this group will discuss emotions they experience before and after a relapse. They will process how experiencing these feelings, or avoidance of experiencing them, relates to having a relapse. Facilitator will guide patients to explore emotions they have related to recovery. Patients will be encouraged to process which emotions are more powerful. They will be guided to discuss the emotional reaction significant others in their lives may have to their relapse or recovery. Patients will be assisted in exploring ways to respond to the emotions of others without this contributing to a relapse.  Therapeutic Goals: 1. Patient will identify two or more emotions that lead to a relapse for them 2. Patient will identify two emotions that result when they relapse 3. Patient will identify two emotions related to recovery 4. Patient will demonstrate ability to communicate their needs through discussion and/or role plays   Summary of Patient Progress:     Therapeutic Modalities:   Cognitive Behavioral Therapy Solution-Focused Therapy Assertiveness Training Relapse Prevention Therapy   Alease FrameSonya S Yannis Gumbs, LCSW 03/03/2018 2:59 PM

## 2018-03-03 NOTE — Progress Notes (Signed)
Received Brandon Orr this AM before breakfast, he is restless this am. He was compliant with his medications. He stated is concentration is poor and rated his anxiety is 4/10. He reviewed his self inventory sheet with this Clinical research associatewriter and very concerned about his college classes and his dog. It was explained to him he will be given a letter to present to his professor. He has been visible in the milieu at intervals.

## 2018-03-04 MED ORDER — TEMAZEPAM 15 MG PO CAPS
30.0000 mg | ORAL_CAPSULE | Freq: Every evening | ORAL | Status: DC | PRN
  Administered 2018-03-04: 30 mg via ORAL
  Filled 2018-03-04: qty 2

## 2018-03-04 NOTE — Progress Notes (Signed)
D- Patient alert and oriented. Patient presents in a pleasant mood on assessment stating that he slept "terrible" last night because of a light being on in his room that he couldn't turn off, "but I asked for a muscle relaxer and that helped". Patient made a statement to this writer that "the doctors don't talk to me, they just prescribe what they want". Patient denies SI, HI, AVH, and pain at this time. Patient also denies any signs/symptoms of depression/anxiety at this time. Patient's goal for today is "getting out of the hospital to see my mother & dog & the [employer]" in which he will accomplish this goal by "be my best self".  A- Scheduled medications administered to patient, per MD orders. Support and encouragement provided.  Routine safety checks conducted every 15 minutes.  Patient informed to notify staff with problems or concerns.  R- No adverse drug reactions noted. Patient contracts for safety at this time. Patient compliant with medications and treatment plan. Patient receptive, calm, and cooperative. Patient interacts well with others on the unit.  Patient remains safe at this time.

## 2018-03-04 NOTE — B[facility] Group Notes (Signed)
LCSW Group Therapy Note  03/04/2018 1:15pm  Type of Therapy and Topic:  Group Therapy:  Fears and Unhealthy Coping Skills  Participation Level:  Active   Description of Group:  The focus of this group was to discuss some of the prevalent fears that patients experience, and to identify the commonalities among group members.  An exercise was used to initiate the discussion, followed by writing on the white board a group-generated list of unhealthy coping and healthy coping techniques to deal with each fear.    Therapeutic Goals: 1. Patient will identify and describe 3 fears they experience 2. Patient will identify one positive coping strategy for each fear they experience 3. Patient will respond empathically to peers statements regarding fears they experience  Summary of Patient Progress:  The patient expressed his thoughts about why he was in the hospital. He shared with the group a letter he wrote about a formula he discovered while in a calculus class that could help him get better and solve the issue with "time". Pt identified his problem to be as the "time", unhealthy coping skill- drinking and smoking cigarettes, and healthy coping skill exercise and taking his lithium. He stated that he know understand that he needs to take his lithium instead of drinking alcohol to show people his formula and be able to communicate with others. He was able to respond empathically to other patient's statements regarding their problems.      Therapeutic Modalities Cognitive Behavioral Therapy Motivational Interviewing  Wandra Babin  CUEBAS-COLON, LCSW 03/04/2018 10:02 AM

## 2018-03-04 NOTE — Plan of Care (Addendum)
Patient found pacing in hallway upon my arrival. Patient continues to pace at this time. Patient is visible but not social throughout the evening. Continues to act bizarrely. Patient has delusions of grandeur related to time travel and mathematics/science. Patient is minimally verbal throughout assessment and appears guarded. Patient is discharge focused and requested his discharge summary. Patient trusts me markedly less after telling him he could not have that until just prior to discharge. Given Restoril for sleep. Patient laid down for only a few minutes and then began pacing again. Patient has no scheduled HS medications and is compliant with staff direction. Patient did not participate in group. Reports eating and voiding adequately Denies pain. Q 15 minute checks maintained. Will continue to monitor throughout the shift. 0205, patient began pacing again after less than 2 hours of sleep. Began asking for things repeatedly (I.e. Drinks, snack, etc.). Encouraged patient to take Vistaril for anxiety. "I'm not anxious but I will take it. It is worth taking once." Patient returned to room. Will monitor for efficacy.  Vistaril ineffective. Patient continues to pace in room. Repeatedly asks for things from nursing (I.e. Food, drinks, shaving, more medications). Reports he did not feel either Restoril or Vistaril. Continually asks for "another pill" as early as 3 minutes after administration.  Patient slept only 1 hour last night. Will endorse care to oncoming shift. Not Progressing Activity: Will verbalize the importance of balancing activity with adequate rest periods 03/04/2018 2229 - Not Progressing by Galen ManilaVigil, Paton Crum E, RN Education: Will be free of psychotic symptoms 03/04/2018 2229 - Not Progressing by Galen ManilaVigil, Lex Linhares E, RN Knowledge of the prescribed therapeutic regimen will improve 03/04/2018 2229 - Not Progressing by Galen ManilaVigil, Chelse Matas E, RN Coping: Ability to cope will improve 03/04/2018 2229 - Not  Progressing by Galen ManilaVigil, Micca Matura E, RN Ability to verbalize feelings will improve 03/04/2018 2229 - Not Progressing by Galen ManilaVigil, Grantham Hippert E, RN Education: Knowledge of disease or condition will improve 03/04/2018 2229 - Not Progressing by Galen ManilaVigil, Purvi Ruehl E, RN Understanding of discharge needs will improve 03/04/2018 2229 - Not Progressing by Galen ManilaVigil, Madline Oesterling E, RN Physical Regulation: Complications related to the disease process, condition or treatment will be avoided or minimized 03/04/2018 2229 - Not Progressing by Galen ManilaVigil, Landynn Dupler E, RN Coping: Communication of feelings regarding changes in body function or appearance will improve 03/04/2018 2229 - Not Progressing by Galen ManilaVigil, Marbin Olshefski E, RN Safety: Ability to disclose and discuss suicidal ideas will improve 03/04/2018 2229 - Not Progressing by Galen ManilaVigil, Genelle Economou E, RN Self-Concept: Level of anxiety will decrease 03/04/2018 2229 - Not Progressing by Galen ManilaVigil, Jacoby Zanni E, RN Education: Knowledge of General Education information will improve 03/04/2018 2229 - Not Progressing by Galen ManilaVigil, Letcher Schweikert E, RN Activity: Risk for activity intolerance will decrease 03/04/2018 2229 - Not Progressing by Galen ManilaVigil, Teri Legacy E, RN

## 2018-03-04 NOTE — Progress Notes (Signed)
Patient alert and oriented x 4, denies SI/HI/AVH, affect is flat but brightens upon approach. Patient is less manic, speech non pressured, able to articulate thoughts and speech is coherent and logical.  Patient is complaint with medication and interacting appropriately with peers and staff. 15 minutes safety checks maintained will continue to monitor. .Marland Kitchen

## 2018-03-04 NOTE — Progress Notes (Signed)
[facility] MD Progress Note  03/04/2018 1:03 PM Brandon Orr  MRN:  [id number] Subjective:  Brandon Orr is a 22 year old male with a history of ADHD admitted in a manic psychotic episode. Pt continue be manic, grandiose, labile, ruminated about light reflection and Newtons laws and mathematics. Poor insight. States he could not sleep because the light in his room "didn't go off'.   Principal Problem: Bipolar I disorder, most recent episode manic, severe with psychotic features (HCC) Diagnosis:   Patient Active Problem List   Diagnosis Date Noted  . Tobacco use disorder [F17.200] 03/03/2018  . Alcohol use disorder, mild, abuse [F10.10] 03/03/2018  . Attention deficit hyperactivity disorder (ADHD) [F90.9] 03/03/2018  . Bipolar I disorder, most recent episode manic, severe with psychotic features (HCC) [F31.2] 03/02/2018   Total Time spent with patient: 25 min  Past Psychiatric History: no ne winfo  Past Medical History: History reviewed. No pertinent past medical history. History reviewed. No pertinent surgical history. Family History: History reviewed. No pertinent family history. Family Psychiatric  History: no new info Social History:  Social History   Substance and Sexual Activity  Alcohol Use Yes     Social History   Substance and Sexual Activity  Drug Use Not on file    Social History   Socioeconomic History  . Marital status: Single    Spouse name: None  . Number of children: None  . Years of education: None  . Highest education level: None  Social Needs  . Financial resource strain: None  . Food insecurity - worry: None  . Food insecurity - inability: None  . Transportation needs - medical: None  . Transportation needs - non-medical: None  Occupational History  . None  Tobacco Use  . Smoking status: Current Some Day Smoker  . Smokeless tobacco: Never Used  Substance and Sexual Activity  . Alcohol use: Yes  . Drug use: None  . Sexual activity: None  Other Topics  Concern  . None  Social History Narrative  . None   Additional Social History:                         Sleep: Poor  Appetite:  Fair  Current Medications: Current Facility-Administered Medications  Medication Dose Route Frequency Provider Last Rate Last Dose  . acetaminophen (TYLENOL) tablet 650 mg  650 mg Oral Q6H PRN Clapacs, John T, MD      . alum & mag hydroxide-simeth (MAALOX/MYLANTA) 200-200-20 MG/5ML suspension 30 mL  30 mL Oral Q4H PRN Clapacs, John T, MD      . hydrOXYzine (ATARAX/VISTARIL) tablet 50 mg  50 mg Oral Q6H PRN Clapacs, John T, MD      . lithium carbonate (LITHOBID) CR tablet 300 mg  300 mg Oral TID Pucilowska, Jolanta B, MD   300 mg at 03/04/18 1255  . magnesium hydroxide (MILK OF MAGNESIA) suspension 30 mL  30 mL Oral Daily PRN Clapacs, John T, MD      . temazepam (RESTORIL) capsule 15 mg  15 mg Oral QHS PRN Clapacs, Jackquline Denmark, MD   15 mg at 03/03/18 2258    Lab Results:  Results for orders placed or performed during the hospital encounter of 03/02/18 (from the past 48 hour(s))  Hemoglobin A1c     Status: Abnormal   Collection Time: 03/03/18  9:31 AM  Result Value Ref Range   Hgb A1c MFr Bld 4.7 (L) 4.8 - 5.6 %    Comment: (  NOTE) Pre diabetes:          5.7%-6.4% Diabetes:              >6.4% Glycemic control for   <7.0% adults with diabetes    Mean Plasma Glucose 88.19 mg/dL    Comment: Performed at [facility], [address], [place], [place] [address]  Lipid panel     Status: None   Collection Time: 03/03/18  9:31 AM  Result Value Ref Range   Cholesterol 161 0 - 200 mg/dL   Triglycerides 89 <604 mg/dL   HDL 65 >54 mg/dL   Total CHOL/HDL Ratio 2.5 RATIO   VLDL 18 0 - 40 mg/dL   LDL Cholesterol 78 0 - 99 mg/dL    Comment:        Total Cholesterol/HDL:CHD Risk Coronary Heart Disease Risk Table                     Men   Women  1/2 Average Risk   3.4   3.3  Average Risk       5.0   4.4  2 X Average Risk   9.6   7.1  3 X Average  Risk  23.4   11.0        Use the calculated Patient Ratio above and the CHD Risk Table to determine the patient's CHD Risk.        ATP III CLASSIFICATION (LDL):  <100     mg/dL   Optimal  098-119  mg/dL   Near or Above                    Optimal  130-159  mg/dL   Borderline  147-829  mg/dL   High  >562     mg/dL   Very High Performed at [facility], [address] [place], [place] [address]   TSH     Status: None   Collection Time: 03/03/18  9:31 AM  Result Value Ref Range   TSH 1.703 0.350 - 4.500 uIU/mL    Comment: Performed by a 3rd Generation assay with a functional sensitivity of <=0.01 uIU/mL. Performed at [facility], [address] [place], [place] [address]     Blood Alcohol level:  Lab Results  Component Value Date   [facility] <10 03/01/2018    Metabolic Disorder Labs: Lab Results  Component Value Date   HGBA1C 4.7 (L) 03/03/2018   MPG 88.19 03/03/2018   No results found for: PROLACTIN Lab Results  Component Value Date   CHOL 161 03/03/2018   TRIG 89 03/03/2018   HDL 65 03/03/2018   CHOLHDL 2.5 03/03/2018   VLDL 18 03/03/2018   LDLCALC 78 03/03/2018    Physical Findings: AIMS: Facial and Oral Movements Muscles of Facial Expression: None, normal Lips and Perioral Area: None, normal Jaw: None, normal Tongue: None, normal,Extremity Movements Upper (arms, wrists, hands, fingers): None, normal Lower (legs, knees, ankles, toes): None, normal, Trunk Movements Neck, shoulders, hips: None, normal, Overall Severity Severity of abnormal movements (highest score from questions above): None, normal Incapacitation due to abnormal movements: None, normal Patient's awareness of abnormal movements (rate only patient's report): No Awareness, Dental Status Current problems with teeth and/or dentures?: No Does patient usually wear dentures?: No  CIWA:  CIWA-Ar Total: 3 COWS:  COWS Total Score: 1  Musculoskeletal: Strength & Muscle Tone: within  normal limits Gait & Station: normal Patient leans:   Psychiatric Specialty Exam:  Physical Exam  Nursing note and vitals reviewed.   ROS  Blood pressure (!) 145/76, pulse 96, temperature 98.3 F (36.8 C), temperature source Oral, resp. rate 18, height 6' (1.829 m), weight 107.5 kg (237 lb), SpO2 99 %.Body mass index is 32.14 kg/m.  General Appearance: Casual  Eye Contact:  Good  Speech:  Clear and Coherent, pressured  Volume:  Normal  Mood:  Dysphoric and Irritable  Affect:  elated  Thought Process:  Goal Directed and Descriptions of Associations: Intact  Orientation:  Full (Time, Place, and Person)  Thought Content:  Delusions and Paranoid Ideation  Suicidal Thoughts:  No  Homicidal Thoughts:  No  Memory:  Immediate;   Fair Recent;   Fair Remote;   Fair  Judgement:  Poor  Insight:  Lacking  Psychomotor Activity:  Normal  Concentration:  Concentration: Fair and Attention Span: Fair  Recall:  [employer]Fair  Fund of Knowledge:  Fair  Language:  Fair  Akathisia:  No  Handed:  Right  AIMS (if indicated):     Assets:  Communication Skills Desire for Improvement Financial Resources/Insurance Housing Physical Health Resilience Social Support Transportation Vocational/Educational  ADL's:  Intact  Cognition:  WNL  Sleep:  Number of Hours: 5        Treatment Plan Summary: Daily contact with patient to assess and evaluate symptoms and progress in treatment and Medication management   Brandon Orr is a [age] year old male with a history of ADHD admitted in a manic psychotic episode. Pt manic, grandiose, delusional, poor insight, not sleeping well. #Mania -continue Lithium 300 mg TID -patient refuses any other medications -increase Restoril as 30  mg nightly PRN  #Labs, metabolic syndrome monitoring -lipid panel, TSH and HgbA1C are unremarkable -EKG, QTc 430  #Disposition -discharged with the mother -follow up with a local provider  Observation Level/Precautions:  15  minute checks  Laboratory:  CBC Chemistry Profile UDS UA  Psychotherapy:    Medications:    Consultations:    Discharge Concerns:    Estimated LOS:  Other:     Physician Treatment Plan for Primary Diagnosis: Bipolar I disorder, most recent episode manic, severe with psychotic features (HCC) Long Term Goal(s): Improvement in symptoms so as ready for discharge  Short Term Goals: Ability to identify changes in lifestyle to reduce recurrence of condition will improve, Ability to verbalize feelings will improve, Ability to disclose and discuss suicidal ideas, Ability to demonstrate self-control will improve, Ability to identify and develop effective coping behaviors will improve, Ability to maintain clinical measurements within normal limits will improve, Compliance with prescribed medications will improve and Ability to identify triggers associated with substance abuse/mental health issues will improve  Physician Treatment Plan for Secondary Diagnosis: Principal Problem:   Bipolar I disorder, most recent episode manic, severe with psychotic features (HCC) Active Problems:   Tobacco use disorder   Alcohol use disorder, mild, abuse   Attention deficit hyperactivity disorder (ADHD)  Long Term Goal(s): Improvement in symptoms so as ready for discharge  Short Term Goals: Ability to identify changes in lifestyle to reduce recurrence of condition will improve, Ability to demonstrate self-control will improve and Ability to identify triggers associated with substance abuse/mental health issues will improve  I certify that inpatient services furnished can reasonably be expected to improve the patient's condition.       Beverly SessionsJagannath Gerrell Tabet, MD 03/04/2018, 1:03 PM

## 2018-03-04 NOTE — Plan of Care (Signed)
  Activity: Will verbalize the importance of balancing activity with adequate rest periods 03/04/2018 0618 - Progressing by Trula OreAjetunmobi, Prudie Guthridge Abisola, RN   Education: Knowledge of the prescribed therapeutic regimen will improve 03/04/2018 0618 - Progressing by Trula OreAjetunmobi, Wassim Kirksey Abisola, RN   Coping: Ability to cope will improve 03/04/2018 0618 - Progressing by Trula OreAjetunmobi, Kyndra Condron Abisola, RN   Education: Knowledge of disease or condition will improve 03/04/2018 0618 - Progressing by Trula OreAjetunmobi, Riannah Stagner Abisola, RN

## 2018-03-04 NOTE — Plan of Care (Signed)
Patient verbalizes as well as demonstrates the importance of balancing activity with adequate rest periods. Patient denies SI/HI/AVH as well as any signs/symptoms of depression/anxiety at this time. Patient verbalizes understanding of the general information that's been provided to him as well as his prescribed therapeutic regimen and has been in compliance thus far without any concerns. Patient has the ability to cope and has shown interest in unit activities by attending groups and actively participating when appropriate. Patient has not experienced any health-related complications at this time. Patient verbalizes understanding of his condition as well as his discharge needs. Patient states that his goal for today is "getting home to see my mom, dog, and frat brothers". Patient has the ability to express positive feelings about himself and has not voiced any concerns or issues with his body image to this Clinical research associatewriter today. Patient has the ability to manage health-related needs and has worked to maintain his clinical measurements within normal limits. Patient's risk for impaired skin integrity as well as activity intolerance has decreased. Patient has achieved adequate nutrition thus far and his overall comfort level has improved. Patient has remained free from injury and remains safe on the unit at this time.

## 2018-03-05 MED ORDER — TRAZODONE HCL 50 MG PO TABS
50.0000 mg | ORAL_TABLET | Freq: Every day | ORAL | Status: DC
  Administered 2018-03-05: 50 mg via ORAL
  Filled 2018-03-05: qty 1

## 2018-03-05 NOTE — Progress Notes (Signed)
D- Patient alert and oriented. Patient presents in a pleasant mood on assessment stating that he doesn't need sleep, "the less sleep I get, the better I feel, I'm testing out my hypothesis". Patient has been pacing all day and it was given to this writer in report that patient paced all night and came to the nurse's station multiple times throughout the night. Patient also stated "if I don't get out of here there's going to be a lot of legal repercussions". Patient has been fixated on being discharged throughout the day. Patient denies SI, HI, AVH, and pain at this time, stating "I would never kill myself, I have a mother". Patient's goal for today is "seeing my dog, mother, and potential fraternity brothers", in which he will do "anything" to accomplish his goal.  A- Scheduled medications administered to patient, per MD orders. Support and encouragement provided.  Routine safety checks conducted every 15 minutes.  Patient informed to notify staff with problems or concerns.  R- No adverse drug reactions noted. Patient contracts for safety at this time. Patient compliant with medications and treatment plan. Patient receptive, calm, and cooperative. Patient interacts well with others on the unit.  Patient remains safe at this time.

## 2018-03-05 NOTE — [facility] Group Notes (Signed)
[facility] Group Notes:  (Nursing/MHT/Case Management/Adjunct)  Date:  03/05/2018  Time:  3:43 AM  Type of Therapy:  Group Therapy  Participation Level:  Active  Participation Quality:  Appropriate  Affect:  Appropriate  Cognitive:  Alert  Insight:  Good  Engagement in Group:  Engaged  Modes of Intervention:  Support  Summary of Progress/Problems:  Mayra NeerJackie L Leronda Lewers 03/05/2018, 3:43 AM

## 2018-03-05 NOTE — Plan of Care (Signed)
Pt is denial, requesting to be discharged. Pacing questioning about his admission.

## 2018-03-05 NOTE — Progress Notes (Signed)
[facility] MD Progress Note  03/05/2018 12:47 PM Brandon Orr  MRN:  [id number] Subjective:  Brandon Orr is a 22 year old male with a history of ADHD admitted in a manic psychotic episode. Pt continue be manic, grandiose, labile, intrusive,  poor insight, demanding to be released today, slept 1hr with 30mg  restoril, pt blames lights for not sleeping.    Principal Problem: Bipolar I disorder, most recent episode manic, severe with psychotic features (HCC) Diagnosis:   Patient Active Problem List   Diagnosis Date Noted  . Tobacco use disorder [F17.200] 03/03/2018  . Alcohol use disorder, mild, abuse [F10.10] 03/03/2018  . Attention deficit hyperactivity disorder (ADHD) [F90.9] 03/03/2018  . Bipolar I disorder, most recent episode manic, severe with psychotic features (HCC) [F31.2] 03/02/2018   Total Time spent with patient: 25 min  Past Psychiatric History: no ne winfo  Past Medical History: History reviewed. No pertinent past medical history. History reviewed. No pertinent surgical history. Family History: History reviewed. No pertinent family history. Family Psychiatric  History: no new info Social History:  Social History   Substance and Sexual Activity  Alcohol Use Yes     Social History   Substance and Sexual Activity  Drug Use Not on file    Social History   Socioeconomic History  . Marital status: Single    Spouse name: None  . Number of children: None  . Years of education: None  . Highest education level: None  Social Needs  . Financial resource strain: None  . Food insecurity - worry: None  . Food insecurity - inability: None  . Transportation needs - medical: None  . Transportation needs - non-medical: None  Occupational History  . None  Tobacco Use  . Smoking status: Current Some Day Smoker  . Smokeless tobacco: Never Used  Substance and Sexual Activity  . Alcohol use: Yes  . Drug use: None  . Sexual activity: None  Other Topics Concern  . None  Social  History Narrative  . None   Additional Social History:                         Sleep: Poor  Appetite:  Fair  Current Medications: Current Facility-Administered Medications  Medication Dose Route Frequency Provider Last Rate Last Dose  . acetaminophen (TYLENOL) tablet 650 mg  650 mg Oral Q6H PRN Clapacs, John T, MD      . alum & mag hydroxide-simeth (MAALOX/MYLANTA) 200-200-20 MG/5ML suspension 30 mL  30 mL Oral Q4H PRN Clapacs, John T, MD      . hydrOXYzine (ATARAX/VISTARIL) tablet 50 mg  50 mg Oral Q6H PRN Clapacs, Jackquline Denmark, MD   50 mg at 03/05/18 0206  . lithium carbonate (LITHOBID) CR tablet 300 mg  300 mg Oral TID Pucilowska, Jolanta B, MD   300 mg at 03/05/18 1121  . magnesium hydroxide (MILK OF MAGNESIA) suspension 30 mL  30 mL Oral Daily PRN Clapacs, John T, MD      . temazepam (RESTORIL) capsule 30 mg  30 mg Oral QHS PRN Beverly Sessions, MD   30 mg at 03/04/18 2117    Lab Results:  No results found for this or any previous visit (from the past 48 hour(s)).  Blood Alcohol level:  Lab Results  Component Value Date   ETH <10 03/01/2018    Metabolic Disorder Labs: Lab Results  Component Value Date   HGBA1C 4.7 (L) 03/03/2018   MPG 88.19 03/03/2018   No  results found for: PROLACTIN Lab Results  Component Value Date   CHOL 161 03/03/2018   TRIG 89 03/03/2018   HDL 65 03/03/2018   CHOLHDL 2.5 03/03/2018   VLDL 18 03/03/2018   LDLCALC 78 03/03/2018    Physical Findings: AIMS: Facial and Oral Movements Muscles of Facial Expression: None, normal Lips and Perioral Area: None, normal Jaw: None, normal Tongue: None, normal,Extremity Movements Upper (arms, wrists, hands, fingers): None, normal Lower (legs, knees, ankles, toes): None, normal, Trunk Movements Neck, shoulders, hips: None, normal, Overall Severity Severity of abnormal movements (highest score from questions above): None, normal Incapacitation due to abnormal movements: None, normal Patient's  awareness of abnormal movements (rate only patient's report): No Awareness, Dental Status Current problems with teeth and/or dentures?: No Does patient usually wear dentures?: No  CIWA:  CIWA-Ar Total: 3 COWS:  COWS Total Score: 1  Musculoskeletal: Strength & Muscle Tone: within normal limits Gait & Station: normal Patient leans:   Psychiatric Specialty Exam: Physical Exam  Nursing note and vitals reviewed.   ROS  Blood pressure (!) 145/86, pulse 91, temperature (!) 97.5 F (36.4 C), temperature source Oral, resp. rate 17, height 6' (1.829 m), weight 107.5 kg (237 lb), SpO2 99 %.Body mass index is 32.14 kg/m.  General Appearance: Casual  Eye Contact:  Good  Speech:  Clear and Coherent, pressured  Volume:  Normal  Mood:  Dysphoric and Irritable  Affect:  elated  Thought Process:  Goal Directed and Descriptions of Associations: Intact  Orientation:  Full (Time, Place, and Person)  Thought Content:  Delusions and Paranoid Ideation, grandiose  Suicidal Thoughts:  No  Homicidal Thoughts:  No  Memory:  Immediate;   Fair Recent;   Fair Remote;   Fair  Judgement:  Poor  Insight:  Lacking  Psychomotor Activity:  Normal  Concentration:  Concentration: Fair and Attention Span: Fair  Recall:  [employer] of Knowledge:  Fair  Language:  Fair  Akathisia:  No  Handed:  Right  AIMS (if indicated):     Assets:  Communication Skills Desire for Improvement Financial Resources/Insurance Housing Physical Health Resilience Social Support Transportation Vocational/Educational  ADL's:  Intact  Cognition:  WNL  Sleep:  Number of Hours: 5        Treatment Plan Summary: Daily contact with patient to assess and evaluate symptoms and progress in treatment and Medication management   Brandon Orr is a 22 year old male with a history of ADHD admitted in a manic psychotic episode. Pt continue to be manic, grandiose, poor insight, not sleeping well. Not willing to to try antipsychotics.   #Mania -continue Lithium 300 mg TID -patient refuses any other medications - Restoril as 30  mg nightly PRN, add trazodone for sleep  #Labs, metabolic syndrome monitoring -lipid panel, TSH and HgbA1C are unremarkable -EKG, QTc 430  #Disposition -discharged with the mother -follow up with a local provider  Observation Level/Precautions:  15 minute checks  Laboratory:  CBC Chemistry Profile UDS UA  Psychotherapy:    Medications:    Consultations:    Discharge Concerns:    Estimated LOS:  Other:     Physician Treatment Plan for Primary Diagnosis: Bipolar I disorder, most recent episode manic, severe with psychotic features (HCC) Long Term Goal(s): Improvement in symptoms so as ready for discharge  Short Term Goals: Ability to identify changes in lifestyle to reduce recurrence of condition will improve, Ability to verbalize feelings will improve, Ability to disclose and discuss suicidal ideas, Ability to demonstrate  self-control will improve, Ability to identify and develop effective coping behaviors will improve, Ability to maintain clinical measurements within normal limits will improve, Compliance with prescribed medications will improve and Ability to identify triggers associated with substance abuse/mental health issues will improve  Physician Treatment Plan for Secondary Diagnosis: Principal Problem:   Bipolar I disorder, most recent episode manic, severe with psychotic features (HCC) Active Problems:   Tobacco use disorder   Alcohol use disorder, mild, abuse   Attention deficit hyperactivity disorder (ADHD)  Long Term Goal(s): Improvement in symptoms so as ready for discharge  Short Term Goals: Ability to identify changes in lifestyle to reduce recurrence of condition will improve, Ability to demonstrate self-control will improve and Ability to identify triggers associated with substance abuse/mental health issues will improve  I certify that inpatient services  furnished can reasonably be expected to improve the patient's condition.       Beverly SessionsJagannath Isais Klipfel, MD 03/05/2018, 12:47 PMPatient ID: Brandon RuaLachlan W Reisch, male   DOB: 1996/11/20, 22 y.o.   MRN: [id number]

## 2018-03-05 NOTE — Plan of Care (Signed)
Patient has not demonstrated the importance of getting adequate rest because he did not sleep last night stating "the less sleep I get, the better I feel, I'm testing my hypothesis". Patient denies SI/HI/AVH as well as any signs/symptoms of depression/anxiety at this time and states to this writer "I would never kill myself, I have a mother". Patient verbalizes understanding of the general information that's been provided to him as well as his prescribed therapeutic regimen and has been in compliance thus far without any concerns. Patient has the ability to cope and has shown interest in unit activities by attending groups, in which he actively participates in as well as going outside to the courtyard when appropriate. Patient has not experienced any health-related complications at this time. Patient verbalizes understanding of his condition as well as his discharge needs. Patient states that his goal for today is "seeing my dog, mother, and potential fraternity brothers". Patient has the ability to express positive feelings about himself and has not voiced any concerns or issues with his body image to this Clinical research associatewriter today. Patient has the ability to manage health-related needs and has worked to maintain his clinical measurements within normal limits. Patient's risk for impaired skin integrity as well as activity intolerance has decreased. Patient has achieved adequate nutrition thus far and his overall comfort level has improved. Patient has remained free from injury and remains safe on the unit at this time.

## 2018-03-05 NOTE — [facility] Group Notes (Signed)
LCSW Group Therapy Note 03/05/2018 1:15pm  Type of Therapy and Topic: Group Therapy: Feelings Around Returning Home & Establishing a Supportive Framework and Supporting Oneself When Supports Not Available  Participation Level: Active  Description of Group:  Patients first processed thoughts and feelings about upcoming discharge. These included fears of upcoming changes, lack of change, new living environments, judgements and expectations from others and overall stigma of mental health issues. The group then discussed the definition of a supportive framework, what that looks and feels like, and how do to discern it from an unhealthy non-supportive network. The group identified different types of supports as well as what to do when your family/friends are less than helpful or unavailable  Therapeutic Goals  1. Patient will identify one healthy supportive network that they can use at discharge. 2. Patient will identify one factor of a supportive framework and how to tell it from an unhealthy network. 3. Patient able to identify one coping skill to use when they do not have positive supports from others. 4. Patient will demonstrate ability to communicate their needs through discussion and/or role plays.  Summary of Patient Progress:  Pt scored his mood 11 (10 best). Pt engaged during group session. As patients processed their anxiety about discharge and described healthy supports patient shared he is ready to leave the hospital. 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 Pt stated he has a strong support system.  Patients identified at least one self-care tool they were willing to use after discharge.   Therapeutic Modalities Cognitive Behavioral Therapy Motivational Interviewing   Brandon Orr  CUEBAS-COLON, LCSW 03/05/2018 12:20 PM

## 2018-03-06 DIAGNOSIS — G4700 Insomnia, unspecified: Secondary | ICD-10-CM

## 2018-03-06 LAB — COMPREHENSIVE METABOLIC PANEL
ALBUMIN: 4.3 g/dL (ref 3.5–5.0)
ALK PHOS: 63 U/L (ref 38–126)
ALT: 50 U/L (ref 17–63)
ANION GAP: 9 (ref 5–15)
AST: 54 U/L — ABNORMAL HIGH (ref 15–41)
BUN: 13 mg/dL (ref 6–20)
CALCIUM: 8.9 mg/dL (ref 8.9–10.3)
CHLORIDE: 98 mmol/L — AB (ref 101–111)
CO2: 24 mmol/L (ref 22–32)
Creatinine, Ser: 0.93 mg/dL (ref 0.61–1.24)
GFR calc Af Amer: >60 mL/min (ref >60)
GFR calc non Af Amer: >60 mL/min (ref >60)
GLUCOSE: 94 mg/dL (ref 65–99)
POTASSIUM: 3.8 mmol/L (ref 3.5–5.1)
SODIUM: 131 mmol/L — AB (ref 135–145)
TOTAL PROTEIN: 7.9 g/dL (ref 6.5–8.1)
Total Bilirubin: 0.7 mg/dL (ref 0.3–1.2)

## 2018-03-06 LAB — LITHIUM LEVEL: Lithium Lvl: 0.5 mmol/L — ABNORMAL LOW (ref 0.60–1.20)

## 2018-03-06 MED ORDER — LITHIUM CARBONATE ER 300 MG PO TBCR
600.0000 mg | EXTENDED_RELEASE_TABLET | Freq: Two times a day (BID) | ORAL | Status: DC
  Administered 2018-03-06 – 2018-03-09 (×6): 600 mg via ORAL
  Filled 2018-03-06 (×6): qty 2

## 2018-03-06 MED ORDER — TRAZODONE HCL 100 MG PO TABS
100.0000 mg | ORAL_TABLET | Freq: Every day | ORAL | Status: DC
  Administered 2018-03-06: 100 mg via ORAL
  Filled 2018-03-06: qty 1

## 2018-03-06 MED ORDER — OLANZAPINE 5 MG PO TBDP
10.0000 mg | ORAL_TABLET | Freq: Every day | ORAL | Status: DC
  Administered 2018-03-06 – 2018-03-08 (×3): 10 mg via ORAL
  Filled 2018-03-06 (×3): qty 2

## 2018-03-06 NOTE — Progress Notes (Signed)
[facility] Progress Note for Medication Administration to Non-consenting Patients (For Involuntarily Committed Patients)  Patient: Joycelyn RuaLachlan W Moltz Date of Birth: [id number]-Jan-1997 MRN: [id number]  Reason for the Medication: The patient, without the benefit of the specific treatment measure, is incapable of participating in any available treatment plan that will give the patient a realistic opportunity of improving the patient's condition.  Consideration of Side Effects: Consideration of the side effects related to the medication plan has been given.  Rationale for Medication Administration: Patient continues with psychosis confusion and intermittent agitation and inability to be discharged.  Current medication so far not adequately effective and patient resistant to changes to medication.  Without changes unlikely to benefit enough to be able to be discharged    Mordecai RasmussenJohn Marion Rosenberry, MD 03/06/18  11:09 AM   This documentation is good for (7) seven days from the date of the MD signature. New documentation must be completed every seven (7) days with detailed justification in the medical record if the patient requires continued non-emergent administration of psychotropic medications.

## 2018-03-06 NOTE — Plan of Care (Signed)
  Progressing Activity: Will verbalize the importance of balancing activity with adequate rest periods 03/06/2018 1850 - Progressing by Elige Radonobb, Macintyre Alexa B, RN Education: Will be free of psychotic symptoms 03/06/2018 1850 - Progressing by Elige Radonobb, Shadasia Oldfield B, RN Knowledge of the prescribed therapeutic regimen will improve 03/06/2018 1850 - Progressing by Elige Radonobb, Shondale Quinley B, RN Coping: Ability to cope will improve 03/06/2018 1850 - Progressing by Elige Radonobb, Delsin Copen B, RN Ability to verbalize feelings will improve 03/06/2018 1850 - Progressing by Elige Radonobb, Natalynn Pedone B, RN Education: Knowledge of disease or condition will improve 03/06/2018 1850 - Progressing by Elige Radonobb, Anuja Manka B, RN Understanding of discharge needs will improve 03/06/2018 1850 - Progressing by Elige Radonobb, Mylo Driskill B, RN Physical Regulation: Complications related to the disease process, condition or treatment will be avoided or minimized 03/06/2018 1850 - Progressing by Elige Radonobb, Tyriq Moragne B, RN Coping: Communication of feelings regarding changes in body function or appearance will improve 03/06/2018 1850 - Progressing by Elige Radonobb, Kauri Garson B, RN Self-Concept: Expressions of positive opinion of body image will increase 03/06/2018 1850 - Progressing by Elige Radonobb, Kyngston Pickelsimer B, RN Ability to verbalize positive feelings about self will improve 03/06/2018 1850 - Progressing by Elige Radonobb, Serafin Decatur B, RN Safety: Ability to disclose and discuss suicidal ideas will improve 03/06/2018 1850 - Progressing by Elige Radonobb, Zimal Weisensel B, RN Ability to identify and utilize support systems that promote safety will improve 03/06/2018 1850 - Progressing by Elige Radonobb, Wendie Diskin B, RN Self-Concept: Ability to verbalize positive feelings about self will improve 03/06/2018 1850 - Progressing by Elige Radonobb, Quintarius Ferns B, RN Level of anxiety will decrease 03/06/2018 1850 - Progressing by Elige Radonobb, Chukwuemeka Artola B, RN Education: Knowledge of General Education information will improve 03/06/2018 1850 - Progressing by Elige Radonobb, Kaytlynn Kochan B,  RN Health Behavior/Discharge Planning: Ability to manage health-related needs will improve 03/06/2018 1850 - Progressing by Elige Radonobb, Jazen Spraggins B, RN Clinical Measurements: Ability to maintain clinical measurements within normal limits will improve 03/06/2018 1850 - Progressing by Elige Radonobb, Dezeray Puccio B, RN Will remain free from infection 03/06/2018 1850 - Progressing by Elige Radonobb, Toivo Bordon B, RN Diagnostic test results will improve 03/06/2018 1850 - Progressing by Elige Radonobb, Loranda Mastel B, RN Respiratory complications will improve 03/06/2018 1850 - Progressing by Elige Radonobb, Tilman Mcclaren B, RN Cardiovascular complication will be avoided 03/06/2018 1850 - Progressing by Elige Radonobb, Rocky Rishel B, RN Activity: Risk for activity intolerance will decrease 03/06/2018 1850 - Progressing by Elige Radonobb, Darielys Giglia B, RN Nutrition: Adequate nutrition will be maintained 03/06/2018 1850 - Progressing by Elige Radonobb, Mohanad Carsten B, RN Coping: Level of anxiety will decrease 03/06/2018 1850 - Progressing by Elige Radonobb, Latana Colin B, RN Elimination: Will not experience complications related to bowel motility 03/06/2018 1850 - Progressing by Elige Radonobb, Bunny Lowdermilk B, RN Will not experience complications related to urinary retention 03/06/2018 1850 - Progressing by Elige Radonobb, Kemiya Batdorf B, RN Pain Managment: General experience of comfort will improve 03/06/2018 1850 - Progressing by Elige Radonobb, Cam Dauphin B, RN Safety: Ability to remain free from injury will improve 03/06/2018 1850 - Progressing by Elige Radonobb, Ardyce Heyer B, RN Skin Integrity: Risk for impaired skin integrity will decrease 03/06/2018 1850 - Progressing by Elige Radonobb, Barkley Kratochvil B, RN

## 2018-03-06 NOTE — Progress Notes (Signed)
[facility] MD Progress Note  03/06/2018 2:37 PM Brandon Orr  MRN:  [id number]  Subjective:   Brandon Orr is slightly better today. He is able to have a decent conversation without getting angry and loud. To the contrary, he has been crying a lot today. He is still delusional about his discovery and believes that security guard stole his secret formula. He is also rather paranoid about his parents, ex-girlfriend and fraternity. In spite of it, he wants to be discharged immediately to return to school.  Spoke with the mother who does not anticipate this patient to return to school before the spring break.  Treatment plan. He has been taking Li 300 mg TID. Li level 0.5 today. Increase to 600 mg BID. He tool Trazodone last night and slept 6 hours as opposed to 1 hour over the weekend. I will start Zyprexa tonight.   Social/disposition. He will return to family. Follow up with [facility].  Principal Problem: Bipolar I disorder, most recent episode manic, severe with psychotic features (Brandon Orr) Diagnosis:   Patient Active Problem List   Diagnosis Date Noted  . Bipolar I disorder, most recent episode manic, severe with psychotic features (Brandon Orr) [F31.2] 03/02/2018    Priority: High  . Tobacco use disorder [F17.200] 03/03/2018  . Alcohol use disorder, mild, abuse [F10.10] 03/03/2018  . Attention deficit hyperactivity disorder (ADHD) [F90.9] 03/03/2018   Total Time spent with patient: 30 minutes  Past Psychiatric History: ADHD  Past Medical History: History reviewed. No pertinent past medical history. History reviewed. No pertinent surgical history. Family History: History reviewed. No pertinent family history. Family Psychiatric  History: bipolar Social History:  Social History   Substance and Sexual Activity  Alcohol Use Yes     Social History   Substance and Sexual Activity  Drug Use Not on file    Social History   Socioeconomic History  . Marital status: Single    Spouse name: None  . Number  of children: None  . Years of education: None  . Highest education level: None  Social Needs  . Financial resource strain: None  . Food insecurity - worry: None  . Food insecurity - inability: None  . Transportation needs - medical: None  . Transportation needs - non-medical: None  Occupational History  . None  Tobacco Use  . Smoking status: Current Some Day Smoker  . Smokeless tobacco: Never Used  Substance and Sexual Activity  . Alcohol use: Yes  . Drug use: None  . Sexual activity: None  Other Topics Concern  . None  Social History Narrative  . None   Additional Social History:                         Sleep: Poor  Appetite:  Fair  Current Medications: Current Facility-Administered Medications  Medication Dose Route Frequency Provider Last Rate Last Dose  . acetaminophen (TYLENOL) tablet 650 mg  650 mg Oral Q6H PRN Clapacs, John T, MD      . alum & mag hydroxide-simeth (MAALOX/MYLANTA) 200-200-20 MG/5ML suspension 30 mL  30 mL Oral Q4H PRN Clapacs, John T, MD      . hydrOXYzine (ATARAX/VISTARIL) tablet 50 mg  50 mg Oral Q6H PRN Clapacs, Madie Reno, MD   50 mg at 03/05/18 2134  . lithium carbonate (LITHOBID) CR tablet 600 mg  600 mg Oral Q12H Armani Brar B, MD      . magnesium hydroxide (MILK OF MAGNESIA) suspension 30 mL  30 mL Oral Daily PRN Clapacs, John T, MD      . temazepam (RESTORIL) capsule 30 mg  30 mg Oral QHS PRN Lenward Chancellor, MD   30 mg at 03/04/18 2117  . traZODone (DESYREL) tablet 100 mg  100 mg Oral QHS Takeyla Million B, MD        Lab Results:  Results for orders placed or performed during the hospital encounter of 03/02/18 (from the past 48 hour(s))  Lithium level     Status: Abnormal   Collection Time: 03/06/18  6:38 AM  Result Value Ref Range   Lithium Lvl 0.50 (L) 0.60 - 1.20 mmol/L    Comment: Performed at [facility], [address] [place], [place] [address]  Comprehensive metabolic panel     Status: Abnormal    Collection Time: 03/06/18  6:38 AM  Result Value Ref Range   Sodium 131 (L) 135 - 145 mmol/L   Potassium 3.8 3.5 - 5.1 mmol/L   Chloride 98 (L) 101 - 111 mmol/L   CO2 24 22 - 32 mmol/L   Glucose, Bld 94 65 - 99 mg/dL   BUN 13 6 - 20 mg/dL   Creatinine, Ser 0.93 0.61 - 1.24 mg/dL   Calcium 8.9 8.9 - 10.3 mg/dL   Total Protein 7.9 6.5 - 8.1 g/dL   Albumin 4.3 3.5 - 5.0 g/dL   AST 54 (H) 15 - 41 U/L   ALT 50 17 - 63 U/L   Alkaline Phosphatase 63 38 - 126 U/L   Total Bilirubin 0.7 0.3 - 1.2 mg/dL   GFR calc non Af Amer >60 >60 mL/min   GFR calc Af Amer >60 >60 mL/min    Comment: (NOTE) The eGFR has been calculated using the CKD EPI equation. This calculation has not been validated in all clinical situations. eGFR's persistently <60 mL/min signify possible Chronic Kidney Disease.    Anion gap 9 5 - 15    Comment: Performed at [facility], [address] [place], [place] [address]    Blood Alcohol level:  Lab Results  Component Value Date   [facility] <10 07/37/1062    Metabolic Disorder Labs: Lab Results  Component Value Date   HGBA1C 4.7 (L) 03/03/2018   MPG 88.19 03/03/2018   No results found for: PROLACTIN Lab Results  Component Value Date   CHOL 161 03/03/2018   TRIG 89 03/03/2018   HDL 65 03/03/2018   CHOLHDL 2.5 03/03/2018   VLDL 18 03/03/2018   LDLCALC 78 03/03/2018    Physical Findings: AIMS: Facial and Oral Movements Muscles of Facial Expression: None, normal Lips and Perioral Area: None, normal Jaw: None, normal Tongue: None, normal,Extremity Movements Upper (arms, wrists, hands, fingers): None, normal Lower (legs, knees, ankles, toes): None, normal, Trunk Movements Neck, shoulders, hips: None, normal, Overall Severity Severity of abnormal movements (highest score from questions above): None, normal Incapacitation due to abnormal movements: None, normal Patient's awareness of abnormal movements (rate only patient's report): No Awareness,  Dental Status Current problems with teeth and/or dentures?: No Does patient usually wear dentures?: No  CIWA:  CIWA-Ar Total: 3 COWS:  COWS Total Score: 1  Musculoskeletal: Strength & Muscle Tone: within normal limits Gait & Station: normal Patient leans: N/A  Psychiatric Specialty Exam: Physical Exam  Nursing note and vitals reviewed. Psychiatric: His speech is normal. His affect is labile. He is withdrawn. Thought content is paranoid and delusional. Cognition and memory are normal. He expresses impulsivity.    Review  of Systems  Neurological: Negative.   Psychiatric/Behavioral: The patient has insomnia.   All other systems reviewed and are negative.   Blood pressure 139/86, pulse 94, temperature 98 F (36.7 C), temperature source Oral, resp. rate 18, height 6' (1.829 m), weight 107.5 kg (237 lb), SpO2 98 %.Body mass index is 32.14 kg/m.  General Appearance: Casual  Eye Contact:  Good  Speech:  Clear and Coherent  Volume:  Normal  Mood:  Irritable  Affect:  Tearful  Thought Process:  Goal Directed and Descriptions of Associations: Intact  Orientation:  Full (Time, Place, and Person)  Thought Content:  Delusions and Paranoid Ideation  Suicidal Thoughts:  No  Homicidal Thoughts:  No  Memory:  Immediate;   Fair Recent;   Fair Remote;   Fair  Judgement:  Poor  Insight:  Lacking  Psychomotor Activity:  Normal  Concentration:  Concentration: Fair and Attention Span: Fair  Recall:  [employer] of Knowledge:  Fair  Language:  Fair  Akathisia:  No  Handed:  Right  AIMS (if indicated):     Assets:  Communication Skills Desire for Improvement Financial Resources/Insurance Housing Physical Health Resilience Social Support  ADL's:  Intact  Cognition:  WNL  Sleep:  Number of Hours: 6.15     Treatment Plan Summary: Daily contact with patient to assess and evaluate symptoms and progress in treatment and Medication management   Mr. Stupka is a 22 year old male with a  history of ADHD admitted in a manic psychotic episode. Pt continue to be manic, grandiose, poor insight. He slept better last night. Will encourage antipsychotics.   #Mania -increase Lithium to 600 mg BID -start Zyprexa 10 mg nightly -increase Trazodone 100 mg nightly, Restoril as 30  mg nightly PRN  #Labs, metabolic syndrome monitoring -lipid panel, TSH and HgbA1C areunremarkable -EKG, QTc 430  #Disposition -discharged with the mother -follow up with [facility]    Orson Slick, MD 03/06/2018, 2:37 PM

## 2018-03-06 NOTE — Progress Notes (Addendum)
Patient has was often seen in the hallway, pacing. Frequently questioning about his medications, the reason for his admission and requesting to be discharged. Patient is alert and oriented and denying thoughts of self harm. Currently not endorsing any hallucinations. Patient is denial and wants to know "who made that decision to bring me here".  Patient presented to the medication room, received Trazodone and Vistaril. Has not been able to sleep so far : he continues to pace. No aggressive behavior, no agitations. Safety precautions reinforced.  0500: Patient went to bed and appeared to be sleeping. Currently out of bed and pacing, frequently calling staff. Appears to be restless. No aggressive behavior. Staff continue to monitor for safety.

## 2018-03-07 MED ORDER — TRAZODONE HCL 100 MG PO TABS
100.0000 mg | ORAL_TABLET | Freq: Every evening | ORAL | Status: DC | PRN
  Administered 2018-03-07 – 2018-03-08 (×2): 100 mg via ORAL
  Filled 2018-03-07 (×2): qty 1

## 2018-03-07 NOTE — Progress Notes (Signed)
Patient ID: Joycelyn RuaLachlan W Kearl, male   DOB: [birth year], 22 y.o.   MRN: [id number] CSW left voicemail for [facility] to make new patient appointment.  Left MRN number and requested call back to schedule with Dr. Daleen Boavi.  Jake SharkSara Ojani Berenson, LCSW

## 2018-03-07 NOTE — Progress Notes (Signed)
[facility] MD Progress Note  03/07/2018 2:52 PM Brandon Orr  MRN:  [id number]  Subjective:   Brandon Orr was rather grandiose in group yesterday and had a fight with his father on the phone earlier today. During extended interview however, he was able to to maintain his cool and avoid all sensitive topics. No longer preoccupied with formula or inventions but rather the humanity, racism, inequality. Took all his medications including Zyprexa last night. Feels "in the fog" and sees "no gras, trees or birds just glass" through the window. No somatic complaints, slept 8 hours.   Spoke with the mother who would like to pick him up on Thursday if possible.  Spoke with the father who has bipolar father, who is very cautious.   Treatment plan. Continue Lithium 600 mg BID, Zyprexa 10 mg nightly.  Social/disposition. Patient may not return to [place] this month. He will be discharged with parents. Follow up with [facility].   Principal Problem: Bipolar I disorder, most recent episode manic, severe with psychotic features ([name]) Diagnosis:   Patient Active Problem List   Diagnosis Date Noted  . Bipolar I disorder, most recent episode manic, severe with psychotic features ([place]) [F31.2] 03/02/2018    Priority: High  . Tobacco use disorder [F17.200] 03/03/2018  . Alcohol use disorder, mild, abuse [F10.10] 03/03/2018  . Attention deficit hyperactivity disorder (ADHD) [F90.9] 03/03/2018   Total Time spent with patient: 45 minutes  Past Psychiatric History: depression  Past Medical History: History reviewed. No pertinent past medical history. History reviewed. No pertinent surgical history. Family History: History reviewed. No pertinent family history. Family Psychiatric  History: bipolar Social History:  Social History   Substance and Sexual Activity  Alcohol Use Yes     Social History   Substance and Sexual Activity  Drug Use Not on file    Social History   Socioeconomic History  . Marital  status: Single    Spouse name: None  . Number of children: None  . Years of education: None  . Highest education level: None  Social Needs  . Financial resource strain: None  . Food insecurity - worry: None  . Food insecurity - inability: None  . Transportation needs - medical: None  . Transportation needs - non-medical: None  Occupational History  . None  Tobacco Use  . Smoking status: Current Some Day Smoker  . Smokeless tobacco: Never Used  Substance and Sexual Activity  . Alcohol use: Yes  . Drug use: None  . Sexual activity: None  Other Topics Concern  . None  Social History Narrative  . None   Additional Social History:                         Sleep: Fair  Appetite:  Fair  Current Medications: Current Facility-Administered Medications  Medication Dose Route Frequency Provider Last Rate Last Dose  . acetaminophen (TYLENOL) tablet 650 mg  650 mg Oral Q6H PRN Clapacs, John T, MD      . alum & mag hydroxide-simeth (MAALOX/MYLANTA) 200-200-20 MG/5ML suspension 30 mL  30 mL Oral Q4H PRN Clapacs, John T, MD      . hydrOXYzine (ATARAX/VISTARIL) tablet 50 mg  50 mg Oral Q6H PRN Clapacs, Madie Reno, MD   50 mg at 03/05/18 2134  . lithium carbonate (LITHOBID) CR tablet 600 mg  600 mg Oral Q12H Aishani Kalis B, MD   600 mg at 03/07/18 0809  . magnesium hydroxide (MILK OF  MAGNESIA) suspension 30 mL  30 mL Oral Daily PRN Clapacs, John T, MD      . OLANZapine zydis (ZYPREXA) disintegrating tablet 10 mg  10 mg Oral QHS Elija Mccamish B, MD   10 mg at 03/06/18 2125  . temazepam (RESTORIL) capsule 30 mg  30 mg Oral QHS PRN Lenward Chancellor, MD   30 mg at 03/04/18 2117  . traZODone (DESYREL) tablet 100 mg  100 mg Oral QHS Paeton Studer B, MD   100 mg at 03/06/18 2125    Lab Results:  Results for orders placed or performed during the hospital encounter of 03/02/18 (from the past 48 hour(s))  Lithium level     Status: Abnormal   Collection Time: 03/06/18  6:38  AM  Result Value Ref Range   Lithium Lvl 0.50 (L) 0.60 - 1.20 mmol/L    Comment: Performed at [facility], [address] [place], [place] [address]  Comprehensive metabolic panel     Status: Abnormal   Collection Time: 03/06/18  6:38 AM  Result Value Ref Range   Sodium 131 (L) 135 - 145 mmol/L   Potassium 3.8 3.5 - 5.1 mmol/L   Chloride 98 (L) 101 - 111 mmol/L   CO2 24 22 - 32 mmol/L   Glucose, Bld 94 65 - 99 mg/dL   BUN 13 6 - 20 mg/dL   Creatinine, Ser 0.93 0.61 - 1.24 mg/dL   Calcium 8.9 8.9 - 10.3 mg/dL   Total Protein 7.9 6.5 - 8.1 g/dL   Albumin 4.3 3.5 - 5.0 g/dL   AST 54 (H) 15 - 41 U/L   ALT 50 17 - 63 U/L   Alkaline Phosphatase 63 38 - 126 U/L   Total Bilirubin 0.7 0.3 - 1.2 mg/dL   GFR calc non Af Amer >60 >60 mL/min   GFR calc Af Amer >60 >60 mL/min    Comment: (NOTE) The eGFR has been calculated using the CKD EPI equation. This calculation has not been validated in all clinical situations. eGFR's persistently <60 mL/min signify possible Chronic Kidney Disease.    Anion gap 9 5 - 15    Comment: Performed at [facility], [address] [place], [place] [address]    Blood Alcohol level:  Lab Results  Component Value Date   [employer] <10 81/44/8185    Metabolic Disorder Labs: Lab Results  Component Value Date   HGBA1C 4.7 (L) 03/03/2018   MPG 88.19 03/03/2018   No results found for: PROLACTIN Lab Results  Component Value Date   CHOL 161 03/03/2018   TRIG 89 03/03/2018   HDL 65 03/03/2018   CHOLHDL 2.5 03/03/2018   VLDL 18 03/03/2018   LDLCALC 78 03/03/2018    Physical Findings: AIMS: Facial and Oral Movements Muscles of Facial Expression: None, normal Lips and Perioral Area: None, normal Jaw: None, normal Tongue: None, normal,Extremity Movements Upper (arms, wrists, hands, fingers): None, normal Lower (legs, knees, ankles, toes): None, normal, Trunk Movements Neck, shoulders, hips: None, normal, Overall Severity Severity of  abnormal movements (highest score from questions above): None, normal Incapacitation due to abnormal movements: None, normal Patient's awareness of abnormal movements (rate only patient's report): No Awareness, Dental Status Current problems with teeth and/or dentures?: No Does patient usually wear dentures?: No  CIWA:  CIWA-Ar Total: 3 COWS:  COWS Total Score: 1  Musculoskeletal: Strength & Muscle Tone: within normal limits Gait & Station: normal Patient leans: N/A  Psychiatric Specialty Exam: Physical Exam  Nursing note  and vitals reviewed. Psychiatric: His speech is normal and behavior is normal. His affect is labile. Thought content is paranoid and delusional. Cognition and memory are normal. He expresses impulsivity.    Review of Systems  Neurological: Negative.   Psychiatric/Behavioral: Negative.   All other systems reviewed and are negative.   Blood pressure 140/77, pulse 90, temperature 97.8 F (36.6 C), temperature source Oral, resp. rate 18, height 6' (1.829 m), weight 107.5 kg (237 lb), SpO2 98 %.Body mass index is 32.14 kg/m.  General Appearance: Casual  Eye Contact:  Good  Speech:  Clear and Coherent  Volume:  Normal  Mood:  Dysphoric  Affect:  Congruent  Thought Process:  Goal Directed and Descriptions of Associations: Intact  Orientation:  Full (Time, Place, and Person)  Thought Content:  Delusions and Paranoid Ideation  Suicidal Thoughts:  No  Homicidal Thoughts:  No  Memory:  Immediate;   Fair Recent;   Fair Remote;   Fair  Judgement:  Poor  Insight:  Lacking  Psychomotor Activity:  Normal  Concentration:  Concentration: Fair and Attention Span: Fair  Recall:  [employer] of Knowledge:  Fair  Language:  Fair  Akathisia:  No  Handed:  Right  AIMS (if indicated):     Assets:  Communication Skills Desire for Improvement Financial Resources/Insurance Housing Physical Health Resilience Social Support Transportation Vocational/Educational  ADL's:   Intact  Cognition:  WNL  Sleep:  Number of Hours: 8     Treatment Plan Summary: Daily contact with patient to assess and evaluate symptoms and progress in treatment and Medication management   Mr. Rodgers is a 22 year old male with a history of ADHD admitted in a manic psychotic episode. Pt continue to bemanic, grandiose, poor insight. He slept better last night. Will encourage antipsychotics.  #Mania -continue Lithium to 600 mg BID, level in AM -continue Zyprexa 10 mg nightly -Trazodone 100 mg PRN  #Labs, metabolic syndrome monitoring -lipid panel, TSH and HgbA1C areunremarkable -EKG, QTc 430  #Disposition -discharged with the mother -follow up with [facility]     Orson Slick, MD 03/07/2018, 2:52 PM

## 2018-03-07 NOTE — Plan of Care (Signed)
  Progressing Education: Will be free of psychotic symptoms 03/07/2018 1313 - Progressing by Elige Radonobb, Edie Vallandingham B, RN Note No signs of psychosis noted.   Knowledge of the prescribed therapeutic regimen will improve 03/07/2018 1313 - Progressing by Elige Radonobb, Nada Godley B, RN Coping: Ability to cope will improve 03/07/2018 1313 - Progressing by Elige Radonobb, Jaionna Weisse B, RN Ability to verbalize feelings will improve 03/07/2018 1313 - Progressing by Elige Radonobb, Alyanna Stoermer B, RN Note Verbalizes feelings without difficulty.  Professed goal is to work on an appropriate discharge plan Education: Understanding of discharge needs will improve 03/07/2018 1313 - Progressing by Elige Radonobb, Amelianna Meller B, RN Physical Regulation: Complications related to the disease process, condition or treatment will be avoided or minimized 03/07/2018 1313 - Progressing by Elige Radonobb, Sundiata Ferrick B, RN Coping: Communication of feelings regarding changes in body function or appearance will improve 03/07/2018 1313 - Progressing by Elige Radonobb, Whitman Meinhardt B, RN Safety: Ability to disclose and discuss suicidal ideas will improve 03/07/2018 1313 - Progressing by Elige Radonobb, Cabrina Shiroma B, RN Note Denies SI or any self harm Ability to identify and utilize support systems that promote safety will improve 03/07/2018 1313 - Progressing by Elige Radonobb, Chaniece Barbato B, RN Self-Concept: Ability to verbalize positive feelings about self will improve 03/07/2018 1313 - Progressing by Elige Radonobb, Daiya Tamer B, RN Level of anxiety will decrease 03/07/2018 1313 - Progressing by Elige Radonobb, Quincie Haroon B, RN Education: Knowledge of General Education information will improve 03/07/2018 1313 - Progressing by Elige Radonobb, Elita Dame B, RN Health Behavior/Discharge Planning: Ability to manage health-related needs will improve 03/07/2018 1313 - Progressing by Elige Radonobb, Kiaira Pointer B, RN Nutrition: Adequate nutrition will be maintained 03/07/2018 1313 - Progressing by Elige Radonobb, Deandrea Rion B, RN Note Good appetite Coping: Level of anxiety will  decrease 03/07/2018 1313 - Progressing by Elige Radonobb, Treysen Sudbeck B, RN Elimination: Will not experience complications related to bowel motility 03/07/2018 1313 - Progressing by Elige Radonobb, Alivya Wegman B, RN Will not experience complications related to urinary retention 03/07/2018 1313 - Progressing by Elige Radonobb, Calianna Kim B, RN Pain Managment: General experience of comfort will improve 03/07/2018 1313 - Progressing by Elige Radonobb, Rage Beever B, RN Safety: Ability to remain free from injury will improve 03/07/2018 1313 - Progressing by Elige Radonobb, Tsuyako Jolley B, RN Skin Integrity: Risk for impaired skin integrity will decrease 03/07/2018 1313 - Progressing by Elige Radonobb, Rambo Sarafian B, RN

## 2018-03-07 NOTE — [facility] Group Notes (Signed)
[facility] Group Notes:  (Nursing/MHT/Case Management/Adjunct)  Date:  03/07/2018  Time:  11:14 PM  Type of Therapy:  Group Therapy  Participation Level:  Active  Participation Quality:  Appropriate  Affect:  Appropriate  Cognitive:  Alert  Insight:  Appropriate  Engagement in Group:  Engaged  Modes of Intervention:  Support  Summary of Progress/Problems:  Mayra NeerJackie L Ahmere Hemenway 03/07/2018, 11:14 PM

## 2018-03-07 NOTE — [facility] Group Notes (Signed)
03/07/2018 1PM  Type of Therapy/Topic:  Group Therapy:  Feelings about Diagnosis  Participation Level:  Active   Description of Group:   This group will allow patients to explore their thoughts and feelings about diagnoses they have received. Patients will be guided to explore their level of understanding and acceptance of these diagnoses. Facilitator will encourage patients to process their thoughts and feelings about the reactions of others to their diagnosis and will guide patients in identifying ways to discuss their diagnosis with significant others in their lives. This group will be process-oriented, with patients participating in exploration of their own experiences, giving and receiving support, and processing challenge from other group members.   Therapeutic Goals: 1. Patient will demonstrate understanding of diagnosis as evidenced by identifying two or more symptoms of the disorder 2. Patient will be able to express two feelings regarding the diagnosis 3. Patient will demonstrate their ability to communicate their needs through discussion and/or role play  Summary of Patient Progress: Actively and appropriately engaged in the group. Patient was able to provide support and validation to other group members.Patient practiced active listening when interacting with the facilitator and other group members Patient in still in the process of obtaining treatment goals. Brandon Orr spoke about a "cloud of sadness" that he was suppressing before being admitted. He says "I now have more understanding and I'm working on addressing this cloud. I want to feel joy and freedom when I leave. I will speak more calmly and clearly to others to get my needs met."      Therapeutic Modalities:   Cognitive Behavioral Therapy Brief Therapy Feelings Identification    Brandon Engels, LCSW 03/07/2018 2:05 PM

## 2018-03-07 NOTE — Progress Notes (Signed)
Patient is alert and oriented x 4, less manic, less intrusive, affect is blunted and assertive towards staff, noted in the hallway pacing back and forth. Patient's thoughts are organized and coherent., he currently denies SI/HI/AVH, mood is pleasant and affect is non aggressive towards staff. Patient is complaint with treatment care and medication. 15 minutes checks maintained will continue to monitor.

## 2018-03-07 NOTE — [facility] Group Notes (Signed)
  03/07/2018  Time: 0900  Type of Therapy and Topic: Group Therapy: Goals Group: SMART Goals   Participation Level:  Did Not Attend   Description of Group:   The purpose of a daily goals group is to assist and guide patients in setting recovery/wellness-related goals. The objective is to set goals as they relate to the crisis in which they were admitted. Patients will be using SMART goal modalities to set measurable goals. Characteristics of realistic goals will be discussed and patients will be assisted in setting and processing how one will reach their goal. Facilitator will also assist patients in applying interventions and coping skills learned in psycho-education groups to the SMART goal and process how one will achieve defined goal.   Therapeutic Goals:  -Patients will develop and document one goal related to or their crisis in which brought them into treatment.  -Patients will be guided by LCSW using SMART goal setting modality in how to set a measurable, attainable, realistic and time sensitive goal.  -Patients will process barriers in reaching goal.  -Patients will process interventions in how to overcome and successful in reaching goal.   Patient's Goal:  Pt was invited to attend group but chose not to attend. CSW will continue to encourage pt to attend group throughout their admission.   Therapeutic Modalities:  Motivational Interviewing  Cognitive Behavioral Therapy  Crisis Intervention Model  SMART goals setting  Heidi DachKelsey Caeson Filippi, MSW, LCSW 03/07/2018 9:34 AM

## 2018-03-07 NOTE — [facility] Group Notes (Signed)
[facility] Group Notes:  (Nursing/MHT/Case Management/Adjunct)  Date:  03/07/2018  Time:  4:20 PM  Type of Therapy:  Psychoeducational Skills  Participation Level:  Active  Participation Quality:  Appropriate, Attentive, Sharing and Supportive  Affect:  Appropriate  Cognitive:  Alert, Appropriate and Oriented  Insight:  Appropriate  Engagement in Group:  Developing/Improving, Engaged and Supportive  Modes of Intervention:  Discussion and Education  Summary of Progress/Problems:  Brandon Orr 03/07/2018, 4:20 PM

## 2018-03-08 LAB — LITHIUM LEVEL: LITHIUM LVL: 0.98 mmol/L (ref 0.60–1.20)

## 2018-03-08 MED ORDER — LITHIUM CARBONATE ER 300 MG PO TBCR: 600.0000 mg | EXTENDED_RELEASE_TABLET | Freq: Two times a day (BID) | ORAL | 1 refills | Status: DC

## 2018-03-08 MED ORDER — OLANZAPINE 10 MG PO TBDP: 10.0000 mg | ORAL_TABLET | Freq: Every day | ORAL | 1 refills | Status: DC

## 2018-03-08 MED ORDER — TRAZODONE HCL 100 MG PO TABS: 100.0000 mg | ORAL_TABLET | Freq: Every evening | ORAL | 1 refills | Status: AC | PRN

## 2018-03-08 MED ORDER — HYDROXYZINE HCL 50 MG PO TABS: 50.0000 mg | ORAL_TABLET | Freq: Four times a day (QID) | ORAL | 1 refills | Status: DC | PRN

## 2018-03-08 NOTE — Progress Notes (Signed)
Patient up ad lib with a steady gait, alert and oriented x 4. Speech is clear with conside thought process noted. Reports that he slept good last night, with a good appetite and good concentration.. Patient states that he is preparing himself for tomorrow's discharge. Attends group with active participation. Denies SI/HI/AAVH, milieu remains safe with q 15 minute safety checks. Will continue to monitor and inform MD of any acute changes in condition.

## 2018-03-08 NOTE — Plan of Care (Signed)
Patient verbalizing understanding of  information received   Some  evidence  of use of coping skills  observed . No safety concerns voiced . Less anxiety noted.

## 2018-03-08 NOTE — [facility] Group Notes (Signed)
  03/08/2018  Time: 1PM  Type of Therapy/Topic:  Group Therapy:  Emotion Regulation  Participation Level:  Active   Description of Group:    The purpose of this group is to assist patients in learning to regulate negative emotions and experience positive emotions. Patients will be guided to discuss ways in which they have been vulnerable to their negative emotions. These vulnerabilities will be juxtaposed with experiences of positive emotions or situations, and patients will be challenged to use positive emotions to combat negative ones. Special emphasis will be placed on coping with negative emotions in conflict situations, and patients will process healthy conflict resolution skills.  Therapeutic Goals: 1. Patient will identify two positive emotions or experiences to reflect on in order to balance out negative emotions 2. Patient will label two or more emotions that they find the most difficult to experience 3. Patient will demonstrate positive conflict resolution skills through discussion and/or role plays  Summary of Patient Progress: Pt continues to work towards their tx goals but has not yet reached them. Pt was able to appropriately participate in group discussion, and was able to offer support/validation to other group members. Pt reported he is feeling, "pretty mellow today. I feel that this place has changed me in ways I didn't want to be changed. I feel like I've been here longer than the literal amount of time I've been here." Pt reported one emotion he has a difficult time expressing is, "sadness. I try to hide it--but I'm okay with that." Pt reported one way he can better regulate his emotions is, "being around my support group."    Therapeutic Modalities:   Cognitive Behavioral Therapy Feelings Identification Dialectical Behavioral Therapy  Heidi DachKelsey Coleston Dirosa, MSW, LCSW 03/08/2018 2:05 PM

## 2018-03-08 NOTE — Discharge Summary (Signed)
Physician Discharge Summary Note  Patient:  Brandon Orr is an 22 y.o., male MRN:  [id number] DOB:  29-Aug-1996 Patient phone:  [phone] (home)  Patient address:   [place] [address],  Total Time spent with patient: 32  Date of Admission:  03/02/2018 Date of Discharge: 03/09/2018  Reason for Admission:  Psychotic break.  History of Present Illness:   Brandon Orr is a 22 year old male with a history of ADHD and depression.  "I discovered a formula."  History of present illness. Information was obtained from the patient and the chart. The patient was brought to the ER by [place] police for disorganized behavior and paranoid delusional thinking. On the day of admission, he had sudden illumination in calculus class. He felt that he discovered a formula that would allow him to travel in time and space and through the black holes. He initially wanted to share it with everybody but later became paranoid that people were trying to steal it from him. He has not been able to sleep, he was talking too much, his thoughts were racing. He went to a bar to have a beer to quiet his mind. The patient has absolutely no insight into his problems. He is elated. 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 Apparently, a week or so ago he had an altercation with his physics professor and just recently broke up with his girlfriend. He denies anxiety. He reports multiple bouts of depression punctuated with times of increased energy that he fights with exercise and yoga. He drinks alcohol occasionally. He does not use drugs.  Met with the mother who came from [place]. Very supportive in favor of Lithium.  Past psychiatric history. Tested and diagnosed with ADHD. Did not like to take stimulants or Wellbutrin. Never hospitalized. Reports suicidal thoughts in 2013 after his sister committed suicide.  Family psychiatric history. Grandfather with bipolar, sister committed  suicide.  Social history. Sophomore at [employer] originally from [place].     Principal Problem: Bipolar I disorder, most recent episode manic, severe with psychotic features [facility]) Discharge Diagnoses: Patient Active Problem List   Diagnosis Date Noted  . Bipolar I disorder, most recent episode manic, severe with psychotic features ([place]) [F31.2] 03/02/2018    Priority: High  . Tobacco use disorder [F17.200] 03/03/2018  . Alcohol use disorder, mild, abuse [F10.10] 03/03/2018  . Attention deficit hyperactivity disorder (ADHD) [F90.9] 03/03/2018    Past Medical History: History reviewed. No pertinent past medical history. History reviewed. No pertinent surgical history. Family History: History reviewed. No pertinent family history.  Social History:  Social History   Substance and Sexual Activity  Alcohol Use Yes     Social History   Substance and Sexual Activity  Drug Use Not on file    Social History   Socioeconomic History  . Marital status: Single    Spouse name: Not on file  . Number of children: Not on file  . Years of education: Not on file  . Highest education level: Not on file  Occupational History  . Not on file  Social Needs  . Financial resource strain: Not on file  . Food insecurity:    Worry: Not on file    Inability: Not on file  . Transportation needs:    Medical: Not on file    Non-medical: Not on file  Tobacco Use  . Smoking status: Current Some Day Smoker  . Smokeless tobacco: Never Used  Substance and  Sexual Activity  . Alcohol use: Yes  . Drug use: Not on file  . Sexual activity: Not on file  Lifestyle  . Physical activity:    Days per week: Not on file    Minutes per session: Not on file  . Stress: Not on file  Relationships  . Social connections:    Talks on phone: Not on file    Gets together: Not on file    Attends religious service: Not on file    Active member of club or organization: Not on file    Attends meetings of  clubs or organizations: Not on file    Relationship status: Not on file  Other Topics Concern  . Not on file  Social History Narrative  . Not on file    Hospital Course:   Brandon Orr is a 22 year old male with a history of ADHD admitted in a manic psychotic episode.   #Mania, resolved -continue Lithiumto 600 mg BID, Li level 0.98 on 3/20 -continue Zyprexa 10 mg nightly -continue Trazodone 100 mg PRN -continue Vistaril 50 mg PRN  #Labs, metabolic syndrome monitoring -lipid panel, TSH and HgbA1C areunremarkable -EKG, QTc 430  #Disposition -discharged with the parents after family meeting -follow up in [place] will be arranged by his parents if the patient does not return to campus -follow up with[facility] in [place]  Physical Findings: AIMS: Facial and Oral Movements Muscles of Facial Expression: None, normal Lips and Perioral Area: None, normal Jaw: None, normal Tongue: None, normal,Extremity Movements Upper (arms, wrists, hands, fingers): None, normal Lower (legs, knees, ankles, toes): None, normal, Trunk Movements Neck, shoulders, hips: None, normal, Overall Severity Severity of abnormal movements (highest score from questions above): None, normal Incapacitation due to abnormal movements: None, normal Patient's awareness of abnormal movements (rate only patient's report): No Awareness, Dental Status Current problems with teeth and/or dentures?: No Does patient usually wear dentures?: No  CIWA:  CIWA-Ar Total: 3 COWS:  COWS Total Score: 1  Musculoskeletal: Strength & Muscle Tone: within normal limits Gait & Station: normal Patient leans: N/A  Psychiatric Specialty Exam: Physical Exam  Nursing note and vitals reviewed. Psychiatric: He has a normal mood and affect. His speech is normal and behavior is normal. Thought content normal. Cognition and memory are normal. He expresses impulsivity.    Review of Systems  Neurological: Negative.   Psychiatric/Behavioral:  Negative.   All other systems reviewed and are negative.   Blood pressure 139/77, pulse 92, temperature 98 F (36.7 C), temperature source Oral, resp. rate 18, height 6' (1.829 m), weight 107.5 kg (237 lb), SpO2 100 %.Body mass index is 32.14 kg/m.  General Appearance: Casual  Eye Contact:  Good  Speech:  Clear and Coherent  Volume:  Normal  Mood:  Euthymic  Affect:  Appropriate  Thought Process:  Goal Directed and Descriptions of Associations: Intact  Orientation:  Full (Time, Place, and Person)  Thought Content:  Delusions  Suicidal Thoughts:  No  Homicidal Thoughts:  No  Memory:  Immediate;   Fair Recent;   Fair Remote;   Fair  Judgement:  Poor  Insight:  Shallow  Psychomotor Activity:  Normal  Concentration:  Concentration: Fair and Attention Span: Fair  Recall:  [employer] of Knowledge:  Fair  Language:  Fair  Akathisia:  No  Handed:  Right  AIMS (if indicated):     Assets:  Communication Skills Desire for Improvement Financial Resources/Insurance Housing Physical Health Resilience Social Support Transportation Vocational/Educational  ADL's:  Intact  Cognition:  WNL  Sleep:  Number of Hours: 7     Have you used any form of tobacco in the last 30 days? (Cigarettes, Smokeless Tobacco, Cigars, and/or Pipes): Yes  Has this patient used any form of tobacco in the last 30 days? (Cigarettes, Smokeless Tobacco, Cigars, and/or Pipes) Yes, Yes, A prescription for an FDA-approved tobacco cessation medication was offered at discharge and the patient refused  Blood Alcohol level:  Lab Results  Component Value Date   [facility] <10 23/55/7322    Metabolic Disorder Labs:  Lab Results  Component Value Date   HGBA1C 4.7 (L) 03/03/2018   MPG 88.19 03/03/2018   No results found for: PROLACTIN Lab Results  Component Value Date   CHOL 161 03/03/2018   TRIG 89 03/03/2018   HDL 65 03/03/2018   CHOLHDL 2.5 03/03/2018   VLDL 18 03/03/2018   Corn 78 03/03/2018    See  Psychiatric Specialty Exam and Suicide Risk Assessment completed by Attending Physician prior to discharge.  Discharge destination:  Home  Is patient on multiple antipsychotic therapies at discharge:  No   Has Patient had three or more failed trials of antipsychotic monotherapy by history:  No  Recommended Plan for Multiple Antipsychotic Therapies: NA  Discharge Instructions    Diet - low sodium heart healthy   Complete by:  As directed    Increase activity slowly   Complete by:  As directed      Allergies as of 03/09/2018   Not on File     Medication List    STOP taking these medications   buPROPion 150 MG 24 hr tablet Commonly known as:  WELLBUTRIN XL     TAKE these medications     Indication  lithium carbonate 300 MG CR tablet Commonly known as:  LITHOBID Take 2 tablets (600 mg total) by mouth every 12 (twelve) hours.  Indication:  Acute Mania   OLANZapine zydis 10 MG disintegrating tablet Commonly known as:  ZYPREXA Take 1 tablet (10 mg total) by mouth at bedtime.  Indication:  Manic-Depression   traZODone 100 MG tablet Commonly known as:  DESYREL Take 1 tablet (100 mg total) by mouth at bedtime as needed for sleep.  Indication:  Trouble Sleeping      Follow-up Information    [facility] Follow up on 03/21/2018.   Why:  You have an appointment scheduled with Brandon. [place] Boy[place] at 9:45 AM.  Please arrive 15 minutes early to complete paperwork. Please bring insurance card and identification.          Follow-up recommendations:  Activity:  as tolerated Diet:  low sodium heart healthy Other:  keep follow up appointments  Comments:    Signed: Orson Slick, MD 03/09/2018, 8:57 AM

## 2018-03-08 NOTE — Plan of Care (Signed)
  Activity: Will verbalize the importance of balancing activity with adequate rest periods 03/08/2018 0353 - Progressing by Trula OreAjetunmobi, Oshay Stranahan Abisola, RN   Education: Will be free of psychotic symptoms 03/08/2018 0353 - Progressing by Trula OreAjetunmobi, Conya Ellinwood Abisola, RN   Education: Knowledge of the prescribed therapeutic regimen will improve 03/08/2018 0353 - Progressing by Trula OreAjetunmobi, Nakai Yard Abisola, RN   Coping: Ability to cope will improve 03/08/2018 0353 - Progressing by Trula OreAjetunmobi, Jazir Newey Abisola, RN   Coping: Ability to verbalize feelings will improve 03/08/2018 0353 - Progressing by Trula OreAjetunmobi, Emery Dupuy Abisola, RN   Education: Knowledge of disease or condition will improve 03/08/2018 0353 - Progressing by Trula OreAjetunmobi, Forever Arechiga Abisola, RN

## 2018-03-08 NOTE — Progress Notes (Signed)
[facility]  03/08/2018 8:13 AM Brandon Orr  MRN:  [id number]  Subjective:   [name] has no complaints but appears restless and anxious. He thinks it is about being in a "lock up" unable to see his dog and his fraternity brothers. He is not allowed on campus until April after the spring break. Slept 7 hours with Zyprexa and Trazodone. Took Vistaril last night as well.   Treatment plan. Continue Lithium 600 mg BID, Li level this am 0.98 and Zyprexa 10 mg for mood stabilization, Trazodone 100 mg as needed for sleep and Vistaril for anxiety.  Social/disposition. He will be discharged tomorrow. Parents are coming to pick him up after family meeting. Follow up with [facility].  Principal Problem: Bipolar I disorder, most recent episode manic, severe with psychotic features (H[facility]) Diagnosis:   Patient Active Problem List   Diagnosis Date Noted  . Bipolar I disorder, most recent episode manic, severe with psychotic features (H[facility]) [F31.2] 03/02/2018    Priority: High  . Tobacco use disorder [F17.200] 03/03/2018  . Alcohol use disorder, mild, abuse [F10.10] 03/03/2018  . Attention deficit hyperactivity disorder (ADHD) [F90.9] 03/03/2018   Total Time spent with patient: 30 minutes  Past Psychiatric History: ADHD  Past Medical History: History reviewed. No pertinent past medical history. History reviewed. No pertinent surgical history. Family History: History reviewed. No pertinent family history. Family Psychiatric  History: grandfather with bipolar Social History:  Social History   Substance and Sexual Activity  Alcohol Use Yes     Social History   Substance and Sexual Activity  Drug Use Not on file    Social History   Socioeconomic History  . Marital status: Single    Spouse name: None  . Number of children: None  . Years of education: None  . Highest education level: None  Social Needs  . Financial resource strain: None  . Food insecurity - worry: None  . Food  insecurity - inability: None  . Transportation needs - medical: None  . Transportation needs - non-medical: None  Occupational History  . None  Tobacco Use  . Smoking status: Current Some Day Smoker  . Smokeless tobacco: Never Used  Substance and Sexual Activity  . Alcohol use: Yes  . Drug use: None  . Sexual activity: None  Other Topics Concern  . None  Social History Narrative  . None   Additional Social History:                         Sleep: Fair  Appetite:  Fair  Current Medications: Current Facility-Administered Medications  Medication Dose Route Frequency Provider Last Rate Last Dose  . acetaminophen (TYLENOL) tablet 650 mg  650 mg Oral Q6H PRN Clapacs, John T, MD      . alum & mag hydroxide-simeth (MAALOX/MYLANTA) 200-200-20 MG/5ML suspension 30 mL  30 mL Oral Q4H PRN Clapacs, John T, MD      . hydrOXYzine (ATARAX/VISTARIL) tablet 50 mg  50 mg Oral Q6H PRN Clapacs, Jackquline DenmarkJohn T, MD   50 mg at 03/07/18 2151  . lithium carbonate (LITHOBID) CR tablet 600 mg  600 mg Oral Q12H Chessa Barrasso B, MD   600 mg at 03/08/18 0756  . magnesium hydroxide (MILK OF MAGNESIA) suspension 30 mL  30 mL Oral Daily PRN Clapacs, John T, MD      . OLANZapine zydis (ZYPREXA) disintegrating tablet 10 mg  10 mg Oral QHS Ahniyah Giancola B, MD  10 mg at 03/07/18 2151  . traZODone (DESYREL) tablet 100 mg  100 mg Oral QHS PRN Samah Lapiana B, MD   100 mg at 03/07/18 2152    Lab Results:  Results for orders placed or performed during the hospital encounter of 03/02/18 (from the past 48 hour(s))  Lithium level     Status: None   Collection Time: 03/08/18  7:27 AM  Result Value Ref Range   Lithium Lvl 0.98 0.60 - 1.20 mmol/L    Comment: Performed at [facility], [address] [place], [place] [address]    Blood Alcohol level:  Lab Results  Component Value Date   [facility] <10 03/01/2018    Metabolic Disorder Labs: Lab Results  Component Value Date   HGBA1C 4.7  (L) 03/03/2018   MPG 88.19 03/03/2018   No results found for: PROLACTIN Lab Results  Component Value Date   CHOL 161 03/03/2018   TRIG 89 03/03/2018   HDL 65 03/03/2018   CHOLHDL 2.5 03/03/2018   VLDL 18 03/03/2018   LDLCALC 78 03/03/2018    Physical Findings: AIMS: Facial and Oral Movements Muscles of Facial Expression: None, normal Lips and Perioral Area: None, normal Jaw: None, normal Tongue: None, normal,Extremity Movements Upper (arms, wrists, hands, fingers): None, normal Lower (legs, knees, ankles, toes): None, normal, Trunk Movements Neck, shoulders, hips: None, normal, Overall Severity Severity of abnormal movements (highest score from questions above): None, normal Incapacitation due to abnormal movements: None, normal Patient's awareness of abnormal movements (rate only patient's report): No Awareness, Dental Status Current problems with teeth and/or dentures?: No Does patient usually wear dentures?: No  CIWA:  CIWA-Ar Total: 3 COWS:  COWS Total Score: 1  Musculoskeletal: Strength & Muscle Tone: within normal limits Gait & Station: normal Patient leans: N/A  Psychiatric Specialty Exam: Physical Exam  Nursing note and vitals reviewed. Psychiatric: His speech is normal and behavior is normal. Judgment and thought content normal. His mood appears anxious. Cognition and memory are normal.    Review of Systems  Neurological: Negative.   Psychiatric/Behavioral: Negative.   All other systems reviewed and are negative.   Blood pressure 129/70, pulse 65, temperature 97.8 F (36.6 C), temperature source Oral, resp. rate 18, height 6' (1.829 m), weight 107.5 kg (237 lb), SpO2 100 %.Body mass index is 32.14 kg/m.  General Appearance: Casual  Eye Contact:  Fair  Speech:  Clear and Coherent  Volume:  Normal  Mood:  Anxious  Affect:  Tearful  Thought Process:  Goal Directed and Descriptions of Associations: Intact  Orientation:  Full (Time, Place, and Person)   Thought Content:  Delusions and Paranoid Ideation  Suicidal Thoughts:  No  Homicidal Thoughts:  No  Memory:  Immediate;   Fair Recent;   Fair Remote;   Fair  Judgement:  Poor  Insight:  Shallow  Psychomotor Activity:  Normal  Concentration:  Concentration: Fair and Attention Span: Fair  Recall:  [employer] of Knowledge:  Fair  Language:  Fair  Akathisia:  No  Handed:  Right  AIMS (if indicated):     Assets:  Communication Skills Desire for Improvement Financial Resources/Insurance Housing Physical Health Resilience Social Support Transportation Vocational/Educational  ADL's:  Intact  Cognition:  WNL  Sleep:  Number of Hours: 7     Treatment Plan Summary: Daily contact with patient to assess and evaluate symptoms and progress in treatment and Medication management   Mr. Westwood is a 22 year old male with a history of ADHD  admitted in a manic psychotic episode. Pt continue to bemanic, grandiose, poor insight. He slept better last night. Will encourageantipsychotics.  #Mania -continue Lithiumto 600 mg BID, Li level on 3/20 0.98 -continue Zyprexa 10 mg nightly -continue Trazodone 100 mg PRN  #Labs, metabolic syndrome monitoring -lipid panel, TSH and HgbA1C areunremarkable -EKG, QTc 430  #Disposition -discharged with the parents following family meeting -[facility]       Kristine Linea, MD 03/08/2018, 8:13 AM

## 2018-03-08 NOTE — [facility] Suicide Risk Assessment (Signed)
[facility]   Principal Problem: Bipolar I disorder, most recent episode manic, severe with psychotic features [facility](HCC) Discharge Diagnoses:  Patient Active Problem List   Diagnosis Date Noted  . Bipolar I disorder, most recent episode manic, severe with psychotic features ([facility]) [F31.2] 03/02/2018    Priority: High  . Tobacco use disorder [F17.200] 03/03/2018  . Alcohol use disorder, mild, abuse [F10.10] 03/03/2018  . Attention deficit hyperactivity disorder (ADHD) [F90.9] 03/03/2018    Total Time spent with patient: 35  Musculoskeletal: Strength & Muscle Tone: within normal limits Gait & Station: normal Patient leans: N/A  Psychiatric Specialty Exam: Review of Systems  Neurological: Negative.   Psychiatric/Behavioral: Negative.   All other systems reviewed and are negative.   Blood pressure 139/77, pulse 92, temperature 98 F (36.7 C), temperature source Oral, resp. rate 18, height 6' (1.829 m), weight 107.5 kg (237 lb), SpO2 100 %.Body mass index is 32.14 kg/m.  General Appearance: Casual  Eye Contact::  Good  Speech:  Clear and Coherent409  Volume:  Normal  Mood:  Euthymic  Affect:  Appropriate  Thought Process:  Goal Directed and Descriptions of Associations: Intact  Orientation:  Full (Time, Place, and Person)  Thought Content:  Delusions  Suicidal Thoughts:  No  Homicidal Thoughts:  No  Memory:  Immediate;   Fair Recent;   Fair Remote;   Fair  Judgement:  Poor  Insight:  Shallow  Psychomotor Activity:  Normal  Concentration:  Fair  Recall:  [employer]  Fund of Knowledge:Fair  Language: Fair  Akathisia:  No  Handed:  Right  AIMS (if indicated):     Assets:  Communication Skills Desire for Improvement Financial Resources/Insurance Housing Leisure Time Resilience Social Support Transportation Vocational/Educational  Sleep:  Number of Hours: 7  Cognition: WNL  ADL's:  Intact   Mental Status Per Nursing Assessment::   On Admission:      Demographic Factors:  Male, Adolescent or young adult and Caucasian  Loss Factors: Loss of significant relationship  Historical Factors: Family history of mental illness or substance abuse and Impulsivity  Risk Reduction Factors:   Sense of responsibility to family, Employed, Living with another person, especially a relative and Positive social support  Continued Clinical Symptoms:  Bipolar Disorder:   Mixed State  Cognitive Features That Contribute To Risk:  None    Suicide Risk:  Minimal: No identifiable suicidal ideation.  Patients presenting with no risk factors but with morbid ruminations; may be classified as minimal risk based on the severity of the depressive symptoms  Follow-up Information    [facility] Follow up on 03/21/2018.   Why:  You have an appointment scheduled with Dr. Abelina BachelorEppen at 9:45 AM.  Please arrive 15 minutes early to complete paperwork. Please bring insurance card and identification.          Plan Of Care/Follow-up recommendations:  Activity:  as tolerated Diet:  low sodiumheart healthy Other:  keep follow up appointments  Kristine LineaJolanta Pucilowska, MD 03/09/2018, 8:56 AM

## 2018-03-08 NOTE — Progress Notes (Signed)
Patient alert and oriented x 4, no distress noted, denies SI/HI/AVH, he appears less manic,and less intrusive, his affect is pleasant, thoughts are organized and speech is non pressured.. Patient is visible on the unit interacting appropriately with peers and staff. Patient was given support and encouraged to attend evening group. Patient attends group and interacts appropriately with peers and staff. 15 minutes safety checks maintained will continue to monitor closely.

## 2018-03-09 LAB — LITHIUM LEVEL: Lithium Lvl: 1.01 mmol/L (ref 0.60–1.20)

## 2018-03-09 NOTE — Progress Notes (Signed)
Patient ID: Brandon Orr, male   DOB: 1996/12/06, 22 y.o.   MRN: [id number] Pleasant on approach, described his day as "I can't make myself happy or sad, different but net positive, I lost my personal by polarity..." Denied SI/HI/AVH, interacting well with peers, logical, coherent and may be inconsistent with c/o symptoms.

## 2018-03-09 NOTE — Progress Notes (Signed)
Pleasant and cooperative. Denies SI/HI/AVH. Discharge instructions reviewed with patient and parents with patients permission.  Patient and parents verbalized understanding.  Prescriptions given and personal belongings returned.  Care relinquished to parents to take home.

## 2018-03-09 NOTE — Progress Notes (Signed)
  [facility] Discharge Plan :  Will you be returning to the same living situation after discharge:  No. At discharge, do you have transportation home?: Yes,  Pt's parents picked him up at discharge. Do you have the ability to pay for your medications: Yes,  Pt has financial assistance from his parents as well as medical insurance.  Release of information consent forms completed and in the chart;  Patient's signature needed at discharge.  Patient to Follow up at: Follow-up Information    [facility] Follow up on 03/21/2018.   Why:  You have an appointment scheduled with Dr. Abelina BachelorEppen at 9:45 AM.  Please arrive 15 minutes early to complete paperwork. Please bring insurance card and identification.          Next level of care provider [facility]:yes  Safety Planning and Suicide Prevention discussed: Yes,  With both parents and pt.  No safety issues noted.  Have you used any form of tobacco in the last 30 days? (Cigarettes, Smokeless Tobacco, Cigars, and/or Pipes): Yes  Has patient been referred to the Quitline?: N/A patient is not a smoker  Patient has been referred for addiction treatment: N/A  Alease FrameSonya S Jeannifer Drakeford, LCSW 03/09/2018, 4:06 PM

## 2018-03-09 NOTE — [facility] Suicide Risk Assessment (Signed)
[facility] INPATIENT:  Family/Significant Other Suicide Prevention Education  Suicide Prevention Education:  Education Completed; Brandon Orr [phone]([phone])  has been identified by the patient as the family member/significant other with whom the patient will be residing, and identified as the person(s) who will aid the patient in the event of a mental health crisis (suicidal ideations/suicide attempt).  With written consent from the patient, the family member/significant other has been provided the following suicide prevention education, prior to the and/or following the discharge of the patient.  The suicide prevention education provided includes the following:  Suicide risk factors  Suicide prevention and interventions  National Suicide Hotline telephone number  [facility] assessment telephone number  [facility] Emergency Assistance 911  [facility] and/or Residential Mobile Crisis Unit telephone number  Request made of family/significant other to:  Remove weapons (e.g., guns, rifles, knives), all items previously/currently identified as safety concern.    Remove drugs/medications (over-the-counter, prescriptions, illicit drugs), all items previously/currently identified as a safety concern.  The family member/significant other verbalizes understanding of the suicide prevention education information provided.  The family member/significant other agrees to remove the items of safety concern listed above.  Alease FrameSonya S Marylou Wages, LCSW 03/09/2018, 4:03 PM

## 2018-03-09 NOTE — Plan of Care (Signed)
Patient slept for Estimated Hours of 7; Precautionary checks every 15 minutes for safety maintained, room free of safety hazards, patient sustains no injury or falls during this shift.  

## 2018-03-09 NOTE — [facility] Group Notes (Signed)
  03/09/2018 9am  Type of Therapy and Topic: Group Therapy: Goals Group: SMART Goals   Participation Level: Active  Description of Group:    The purpose of a daily goals group is to assist and guide patients in setting recovery/wellness-related goals. The objective is to set goals as they relate to the crisis in which they were admitted. Patients will be using SMART goal modalities to set measurable goals. Characteristics of realistic goals will be discussed and patients will be assisted in setting and processing how one will reach their goal. Facilitator will also assist patients in applying interventions and coping skills learned in psycho-education groups to the SMART goal and process how one will achieve defined goal.   Therapeutic Goals:   -Patients will develop and document one goal related to or their crisis in which brought them into treatment.  -Patients will be guided by LCSW using SMART goal setting modality in how to set a measurable, attainable, realistic and time sensitive goal.  -Patients will process barriers in reaching goal.  -Patients will process interventions in how to overcome and successful in reaching goal.   Patient's Goal: "To have a meeting at 10:30am about discharge."  Therapeutic Modalities:  Motivational Interviewing  Cognitive Behavioral Therapy  Crisis Intervention Model  SMART goals setting  Johny ShearsCassandra  Alexandre Lightsey, LCSW 03/09/2018 9:44 AM

## 2018-03-21 ENCOUNTER — Other Ambulatory Visit: Payer: Self-pay

## 2018-03-21 ENCOUNTER — Encounter: Payer: Self-pay | Admitting: Psychiatry

## 2018-03-21 ENCOUNTER — Ambulatory Visit: Payer: 59 | Admitting: Psychiatry

## 2018-03-21 VITALS — BP 120/79 | HR 71 | Temp 99.0°F | Wt 239.8 lb

## 2018-03-21 DIAGNOSIS — F1010 Alcohol abuse, uncomplicated: Secondary | ICD-10-CM | POA: Diagnosis not present

## 2018-03-21 DIAGNOSIS — F312 Bipolar disorder, current episode manic severe with psychotic features: Secondary | ICD-10-CM

## 2018-03-21 NOTE — Progress Notes (Signed)
Psychiatric Initial Adult Assessment   Patient Identification: Brandon Orr MRN:  [id number] Date of Evaluation:  03/21/2018 Referral Source: [facility] Chief Complaint: ' I am ok.' Chief Complaint    Establish Care; Anxiety; Depression; Stress     Visit Diagnosis:    ICD-10-CM   1. Bipolar I disorder, most recent episode manic, severe with psychotic features (HCC) F31.2    improving  2. Alcohol use disorder, mild, abuse F10.10     History of Present Illness:  Brandon Orr is a 22 year old Caucasian male, student at [employer], originally from [place], presented to the clinic today to establish care.  He was recently discharged from [facility] after a manic episode.  Patient was admitted there from 3/14- 3/21.  Per review of the EHR medical records from [facility] patient was brought into the emergency department by [facility] campus police for disorganized behavior and paranoid thought process.  He had his first psychotic break per medical records and was admitted for bipolar type I manic episode.  Patient was stabilized and discharged on Zyprexa, lithium as well as trazodone.  Reviewed lithium level prior to discharge was 1.01-therapeutic.  Patient today seen as calm and cooperative.  Patient reports his mood as improving.  He denies any significant mood lability.  He however seems to be mildly irritable on and off however was able to cooperate.  Patient does report some anxiety symptoms on and off.  He reports he is nervous due to the fact that he went through a lot of psychosocial stressors due to his recent mental health admission.  He reports he missed a lot of classes, his GPA went down.  He also lost his girlfriend as well as a lot of friends who does not want to be with him anymore because of the incident.  He reports 1 of his teachers also said some mean things to him which made him sad.  He however reports he does not think any of this can keep him down.  He reports he is going to work hard  especially in that specific teacher's class to do his best.  He reports he is going to work hard so that he can get a better GPA.  He reports his long-term goal is to go to [place] since he does not like to be in this country anymore.  Pt reports sleep as fair.  He reports there are nights when he is anxious and needs a trazodone.  He reports he takes a lower dose of trazodone as needed.  He has not required it much.  Patient reports he is compliant on his lithium as prescribed.  He however reports he may have had  some side effects like dry skin.  He wonders whether lithium can cause it.  He reports Zyprexa caused him drooling and made him extremely drowsy.  Patient reports he took Zyprexa only 3-4 times after he was discharged from the inpatient unit.  Patient reports he does not want to be continued on the Zyprexa at this time.  He does report a history of trauma.  He reports his sister committed suicide when he was 60 years old and she was 52.  He reports it does affect him but he does not think about it too much.  He also reports a lot of other trauma.  He reports his father was verbally abusive but also cared for him.  He does report a history of being delusional during his most recent inpatient admission.  He felt paranoid and grandiose.  He denies any such perceptual disturbances at this time  Patient does report some alcoholism.  He reports he drinks 4-5 beers every few days.  He does not consider it as binging because he feels it does not make him drowsy or does not interfere with his functioning.  Patient reports he continues to have good social support from his family.         Associated Signs/Symptoms: Depression Symptoms:  depressed mood, anxiety, (Hypo) Manic Symptoms:  Labiality of Mood, Anxiety Symptoms:  denies Psychotic Symptoms:  hx of delusions , denies now PTSD Symptoms: Had a traumatic exposure:  as noted above  Past Psychiatric History: History of bipolar  disorder, recent diagnosis-[facility] March [facility].  She denies any history of suicide attempts.  Previous Psychotropic Medications: Yes lithium, Zyprexa, Vistaril, trazodone  Substance Abuse History in the last 12 months:  Yes.  Alcohol-mild.  Patient reports he drinks twice every week, 3-4 beer each time  Consequences of Substance Abuse: Negative  Past Medical History:  Past Medical History:  Diagnosis Date  . Bipolar disorder (HCC)   . Depression    No past surgical history on file.  Family Psychiatric History: Sister-committed suicide.  Paternal grandfather-bipolar disorder.Mother -Depression.  Family History:  Family History  Problem Relation Age of Onset  . Bipolar disorder Paternal Grandfather   . Suicidality Sister     Social History:   Social History   Socioeconomic History  . Marital status: Single    Spouse name: Not on file  . Number of children: 0  . Years of education: Not on file  . Highest education level: Some college, no degree  Occupational History  . Not on file  Social Needs  . Financial resource strain: Not hard at all  . Food insecurity:    Worry: Never true    Inability: Never true  . Transportation needs:    Medical: No    Non-medical: No  Tobacco Use  . Smoking status: Former Games developermoker  . Smokeless tobacco: Never Used  Substance and Sexual Activity  . Alcohol use: Not Currently  . Drug use: Not Currently  . Sexual activity: Yes  Lifestyle  . Physical activity:    Days per week: 4 days    Minutes per session: 150+ min  . Stress: Not on file  Relationships  . Social connections:    Talks on phone: Twice a week    Gets together: Never    Attends religious service: Never    Active member of club or organization: No    Attends meetings of clubs or organizations: Never    Relationship status: Never married  Other Topics Concern  . Not on file  Social History Narrative  . 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  He reports he is originally from [place].  Raised by both mom and dad.  He had one sister who committed suicide when he was 22 years old.  He reports he had a privileged childhood.  Allergies:  No Known Allergies  Metabolic Disorder Labs: Lab Results  Component Value Date   HGBA1C 4.7 (L) 03/03/2018   MPG 88.19 03/03/2018   No results found for: PROLACTIN Lab Results  Component Value Date   CHOL 161 03/03/2018   TRIG 89 03/03/2018   HDL 65 03/03/2018   CHOLHDL 2.5  03/03/2018   VLDL 18 03/03/2018   LDLCALC 78 03/03/2018     Current Medications: Current Outpatient Medications  Medication Sig Dispense Refill  . lithium carbonate (LITHOBID) 300 MG CR tablet Take 2 tablets (600 mg total) by mouth every 12 (twelve) hours. 120 tablet 1  . traZODone (DESYREL) 100 MG tablet Take 1 tablet (100 mg total) by mouth at bedtime as needed for sleep. 30 tablet 1   No current facility-administered medications for this visit.     Neurologic: Headache: No Seizure: No Paresthesias:No  Musculoskeletal: Strength & Muscle Tone: within normal limits Gait & Station: normal Patient leans: N/A  Psychiatric Specialty Exam: Review of Systems  Psychiatric/Behavioral: Positive for depression. The patient is nervous/anxious.   All other systems reviewed and are negative.   Blood pressure 120/79, pulse 71, temperature 99 F (37.2 C), temperature source Oral, weight 239 lb 12.8 oz (108.8 kg).Body mass index is 32.52 kg/m.  General Appearance: Casual  Eye Contact:  Fair  Speech:  Clear and Coherent  Volume:  Normal  Mood:  Dysphoric  Affect:  Congruent  Thought Process:  Goal Directed and Descriptions of Associations: Intact  Orientation:  Full (Time, Place, and Person)  Thought Content:  Logical  Suicidal Thoughts:  No  Homicidal Thoughts:  No  Memory:  Immediate;   Fair Recent;    Fair Remote;   Fair  Judgement:  Fair  Insight:  Fair  Psychomotor Activity:  Normal  Concentration:  Concentration: Fair and Attention Span: Fair  Recall:  [employer] of Knowledge:Fair  Language: Fair  Akathisia:  No  Handed:  Right  AIMS (if indicated): 0  Assets:  Communication Skills Desire for Improvement Housing Social Support Talents/Skills Transportation Vocational/Educational  ADL's:  Intact  Cognition: WNL  Sleep:  fair    Treatment Plan Summary:Osborn is a 22 year old Caucasian male who has a history of recent diagnosis of bipolar disorder, alcohol disorder, presented to the clinic today to establish care.  Patient was recently discharged from [facility] unit on 03/09/2018.  Patient today appears to be cooperative and calm.  Patient does report some side effects to his medications like Zyprexa.  Discussed medication changes with patient.  Patient denies any suicidality.  Patient is motivated to stay in treatment.  Patient also reports he would like to start psychotherapy with a counselor at [facility].  Plan as noted below. Medication management and Plan as noted below Plan For bipolar disorder Continue lithium as prescribed.  No changes made today We will get another lithium level since he is complaining of some side effects.  Provided lab slips.  His most recent lithium level prior to discharge noted as therapeutic-1.01 ( 03/09/2018) Patient reports he is noncompliant with Zyprexa due to side effects.  We will discontinue the same.  Screening :FIBSER -mild to moderate impairment impairment.  Insomnia Continue trazodone 25-50 mg p.o. nightly as needed  For alcohol use disorder mild Patient continues to minimize his use.  We will continue to monitor.  Provided counseling.  Discussed with patient to reach out to counselor at [facility] for psychotherapy.  I have reviewed discharge notes from [facility].  Follow-up in clinic in 2 weeks or sooner if  needed.  More than 50 % of the time was spent for psychoeducation and supportive psychotherapy and care coordination.  This note was generated in part or whole with voice recognition software. Voice recognition is usually quite accurate but there are transcription errors that  can and very often do occur. I apologize for any typographical errors that were not detected and corrected.       Brandon Longs, MD 4/2/20191:07 PM

## 2018-03-21 NOTE — Patient Instructions (Signed)
Please get your lithium level .  Please schedule an appointment with therapist.

## 2018-03-23 ENCOUNTER — Telehealth: Payer: Self-pay | Admitting: Psychiatry

## 2018-03-29 NOTE — Telephone Encounter (Signed)
Confirmed that he follows up with Dr. Mariam DollarEapen [facility]. Compliant with meds. No safety issues.

## 2018-04-05 ENCOUNTER — Telehealth: Payer: Self-pay

## 2018-04-05 ENCOUNTER — Other Ambulatory Visit: Payer: Self-pay | Admitting: Psychiatry

## 2018-04-05 NOTE — Telephone Encounter (Signed)
pt callled and states that he is having tremers in his hands and he is on lithium and he is concern because they are getting worse.

## 2018-04-05 NOTE — Telephone Encounter (Signed)
thanks

## 2018-04-05 NOTE — Telephone Encounter (Signed)
pls ask him to come to clinic tomorrow to see me. pls call and schedule an appointment. He may also need a Li level done again . We can discuss med changes when he comes in.

## 2018-04-05 NOTE — Telephone Encounter (Signed)
Left message for pt to call office back to make appt to be seen for tomorrow.

## 2018-04-06 ENCOUNTER — Telehealth: Payer: Self-pay | Admitting: Psychiatry

## 2018-04-06 ENCOUNTER — Encounter: Payer: Self-pay | Admitting: Psychiatry

## 2018-04-06 ENCOUNTER — Other Ambulatory Visit: Payer: Self-pay

## 2018-04-06 ENCOUNTER — Ambulatory Visit (INDEPENDENT_AMBULATORY_CARE_PROVIDER_SITE_OTHER): Payer: 59 | Admitting: Psychiatry

## 2018-04-06 ENCOUNTER — Emergency Department
Admission: EM | Admit: 2018-04-06 | Discharge: 2018-04-06 | Disposition: A | Discharge status: Home / Self Care | Payer: 59 | Attending: Emergency Medicine | Admitting: Emergency Medicine

## 2018-04-06 VITALS — BP 130/84 | HR 84 | Temp 98.7°F | Wt 235.8 lb

## 2018-04-06 DIAGNOSIS — T56891A Toxic effect of other metals, accidental (unintentional), initial encounter: Secondary | ICD-10-CM | POA: Diagnosis not present

## 2018-04-06 DIAGNOSIS — F312 Bipolar disorder, current episode manic severe with psychotic features: Secondary | ICD-10-CM

## 2018-04-06 DIAGNOSIS — F319 Bipolar disorder, unspecified: Secondary | ICD-10-CM | POA: Insufficient documentation

## 2018-04-06 DIAGNOSIS — G252 Other specified forms of tremor: Secondary | ICD-10-CM

## 2018-04-06 DIAGNOSIS — Z79899 Other long term (current) drug therapy: Secondary | ICD-10-CM | POA: Insufficient documentation

## 2018-04-06 DIAGNOSIS — M62838 Other muscle spasm: Secondary | ICD-10-CM | POA: Diagnosis not present

## 2018-04-06 DIAGNOSIS — F1010 Alcohol abuse, uncomplicated: Secondary | ICD-10-CM

## 2018-04-06 DIAGNOSIS — F329 Major depressive disorder, single episode, unspecified: Secondary | ICD-10-CM | POA: Diagnosis not present

## 2018-04-06 DIAGNOSIS — R7989 Other specified abnormal findings of blood chemistry: Secondary | ICD-10-CM | POA: Diagnosis not present

## 2018-04-06 DIAGNOSIS — Z87891 Personal history of nicotine dependence: Secondary | ICD-10-CM | POA: Diagnosis not present

## 2018-04-06 DIAGNOSIS — R253 Fasciculation: Secondary | ICD-10-CM | POA: Diagnosis present

## 2018-04-06 LAB — COMPREHENSIVE METABOLIC PANEL
ALBUMIN: 4.2 g/dL (ref 3.5–5.0)
ALT: 11 IU/L (ref 0–44)
ALT: 14 U/L — ABNORMAL LOW (ref 17–63)
ANION GAP: 4 — AB (ref 5–15)
AST: 15 IU/L (ref 0–40)
AST: 21 U/L (ref 15–41)
Albumin/Globulin Ratio: 1.6 (ref 1.2–2.2)
Albumin: 4.6 g/dL (ref 3.5–5.5)
Alkaline Phosphatase: 89 IU/L (ref 39–117)
Alkaline Phosphatase: 79 U/L (ref 38–126)
BUN/Creatinine Ratio: 12 (ref 9–20)
BUN: 12 mg/dL (ref 6–20)
BUN: 17 mg/dL (ref 6–20)
Bilirubin Total: 0.5 mg/dL (ref 0.0–1.2)
CALCIUM: 9.5 mg/dL (ref 8.7–10.2)
CO2: 24 mmol/L (ref 20–29)
CO2: 25 mmol/L (ref 22–32)
CREATININE: 1.03 mg/dL (ref 0.76–1.27)
Calcium: 9.1 mg/dL (ref 8.9–10.3)
Chloride: 98 mmol/L (ref 96–106)
Chloride: 103 mmol/L (ref 101–111)
Creatinine, Ser: 0.88 mg/dL (ref 0.61–1.24)
GFR calc Af Amer: 119 mL/min/{1.73_m2} (ref >59)
GFR calc Af Amer: >60 mL/min (ref >60)
GFR calc non Af Amer: >60 mL/min (ref >60)
GFR, EST NON AFRICAN AMERICAN: 103 mL/min/{1.73_m2} (ref >59)
GLOBULIN, TOTAL: 2.9 g/dL (ref 1.5–4.5)
GLUCOSE: 83 mg/dL (ref 65–99)
GLUCOSE: 121 mg/dL — AB (ref 65–99)
POTASSIUM: 4.3 mmol/L (ref 3.5–5.1)
Potassium: 4.8 mmol/L (ref 3.5–5.2)
SODIUM: 135 mmol/L (ref 134–144)
Sodium: 132 mmol/L — ABNORMAL LOW (ref 135–145)
TOTAL PROTEIN: 7.5 g/dL (ref 6.5–8.1)
Total Bilirubin: 0.8 mg/dL (ref 0.3–1.2)
Total Protein: 7.5 g/dL (ref 6.0–8.5)

## 2018-04-06 LAB — CBC
HCT: 45.4 % (ref 40.0–52.0)
Hemoglobin: 15.4 g/dL (ref 13.0–18.0)
MCH: 29.7 pg (ref 26.0–34.0)
MCHC: 33.9 g/dL (ref 32.0–36.0)
MCV: 87.8 fL (ref 80.0–100.0)
Platelets: 267 10*3/uL (ref 150–440)
RBC: 5.17 MIL/uL (ref 4.40–5.90)
RDW: 13.6 % (ref 11.5–14.5)
WBC: 9.7 10*3/uL (ref 3.8–10.6)

## 2018-04-06 LAB — LITHIUM LEVEL
Lithium Lvl: 1.3 mmol/L — ABNORMAL HIGH (ref 0.6–1.2)
Lithium Lvl: 1.25 mmol/L — ABNORMAL HIGH (ref 0.60–1.20)

## 2018-04-06 MED ORDER — SODIUM CHLORIDE 0.9 % IV BOLUS
1000.0000 mL | Freq: Once | INTRAVENOUS | Status: AC
  Administered 2018-04-06: 1000 mL via INTRAVENOUS

## 2018-04-06 MED ORDER — SODIUM CHLORIDE 0.9 % IV BOLUS: 1000.0000 mL | Freq: Once | INTRAVENOUS | Status: DC

## 2018-04-06 MED ORDER — SODIUM CHLORIDE 0.9 % IV BOLUS
1000.0000 mL | Freq: Once | INTRAVENOUS | Status: DC
  Administered 2018-04-06: 1000 mL via INTRAVENOUS

## 2018-04-06 MED ORDER — LITHIUM CARBONATE ER 300 MG PO TBCR: 300.0000 mg | EXTENDED_RELEASE_TABLET | Freq: Two times a day (BID) | ORAL | 1 refills | Status: AC

## 2018-04-06 NOTE — ED Provider Notes (Signed)
[facility] Emergency Department Provider Note    First MD Initiated Contact with Patient 04/06/18 [phone]     (approximate)  I have reviewed the triage vital signs and the nursing notes.   HISTORY  Chief Complaint possible lithium toxicity    HPI Brandon Orr is a 22 y.o. male with history of bipolar disorder currently taking lithium presents to the emergency department concern for lithium toxicity secondary to muscle spasmsof the right arm and left shoulder. Patient denies any abdominal pain or vomiting. Patient denies any diarrhea or constipation. Patient denies any chest pain or palpitation. Patient denies any dyspnea. Patient denies any mood changes. Patient was referred to the emergency department by Dr.Eappen his psychiatrist.patient states that his lithium level is 1.3 yesterday and that he's been orally hydrating however that he did indeed continue to take lithium last dose this morning.   Past Medical History:  Diagnosis Date  . Bipolar disorder (HCC)   . Depression     Patient Active Problem List   Diagnosis Date Noted  . Tobacco use disorder 03/03/2018  . Alcohol use disorder, mild, abuse 03/03/2018  . Attention deficit hyperactivity disorder (ADHD) 03/03/2018  . Bipolar I disorder, most recent episode manic, severe with psychotic features (HCC) 03/02/2018  . Concussion wth loss of consciousness of 30 minutes or less 11/08/2016  . Acute post-traumatic headache, not intractable 10/13/2016  . Post concussion syndrome 10/13/2016    History reviewed. No pertinent surgical history.  Prior to Admission medications   Medication Sig Start Date End Date Taking? Authorizing Provider  lithium carbonate (LITHOBID) 300 MG CR tablet Take 2 tablets (600 mg total) by mouth every 12 (twelve) hours. 03/08/18   Pucilowska, Braulio Conte B, MD  traZODone (DESYREL) 100 MG tablet Take 1 tablet (100 mg total) by mouth at bedtime as needed for sleep. 03/08/18    Pucilowska, Ellin Goodie, MD    Allergies no known drug allergies  Family History  Problem Relation Age of Onset  . Bipolar disorder Paternal Grandfather   . Suicidality Sister     Social History Social History   Tobacco Use  . Smoking status: Former Games developer  . Smokeless tobacco: Never Used  Substance Use Topics  . Alcohol use: Yes  . Drug use: Not Currently    Review of Systems Constitutional: No fever/chills Eyes: No visual changes. ENT: No sore throat. Cardiovascular: Denies chest pain. Respiratory: Denies shortness of breath. Gastrointestinal: No abdominal pain.  No nausea, no vomiting.  No diarrhea.  No constipation. Genitourinary: Negative for dysuria. Musculoskeletal: Negative for neck pain.  Negative for back pain. Integumentary: Negative for rash. Neurological: Negative for headaches, focal weakness or numbness. Psychiatric:possible lithium toxicity   ____________________________________________   PHYSICAL EXAM:  VITAL SIGNS: ED Triage Vitals  Enc Vitals Group     BP 04/06/18 0937 (!) 142/88     Pulse Rate 04/06/18 0937 77     Resp 04/06/18 0937 17     Temp 04/06/18 0937 98.1 F (36.7 C)     Temp Source 04/06/18 0937 Oral     SpO2 04/06/18 0937 98 %     Weight 04/06/18 0938 106.6 kg (235 lb)     Height 04/06/18 0938 1.829 m (6')     Head Circumference --      Peak Flow --      Pain Score 04/06/18 0938 0     Pain Loc --      Pain Edu? --  Excl. in GC? --     Constitutional: Alert and oriented. Well appearing and in no acute distress. Eyes: Conjunctivae are normal. PERRL. EOMI. Head: Atraumatic. Mouth/Throat: Mucous membranes are moist. Oropharynx non-erythematous. Neck: No stridor.   Cardiovascular: Normal rate, regular rhythm. Good peripheral circulation. Grossly normal heart sounds. Respiratory: Normal respiratory effort.  No retractions. Lungs CTAB. Gastrointestinal: Soft and nontender. No distention.  Musculoskeletal: No lower  extremity tenderness nor edema. No gross deformities of extremities. Neurologic:  Normal speech and language. No gross focal neurologic deficits are appreciated.  Skin:  Skin is warm, dry and intact. No rash noted. Psychiatric: Mood and affect are normal. Speech and behavior are normal.  ____________________________________________   LABS (all labs ordered are listed, but only abnormal results are displayed)  Labs Reviewed  COMPREHENSIVE METABOLIC PANEL - Abnormal; Notable for the following components:      Result Value   Sodium 132 (*)    Glucose, Bld 121 (*)    ALT 14 (*)    Anion gap 4 (*)    All other components within normal limits  LITHIUM LEVEL - Abnormal; Notable for the following components:   Lithium Lvl 1.25 (*)    All other components within normal limits  CBC   ____________________________________________  EKG  ED ECG REPORT I, [place] N BROWN, the attending physician, personally viewed and interpreted this ECG.   Date: 04/06/2018  EKG Time: 9:46 AM  Rate: 86  Rhythm: normal sinus rhythm  Axis: normal  Intervals:normal  ST&T Change: none  ____________________________________________     Procedures   ____________________________________________   INITIAL IMPRESSION / ASSESSMENT AND PLAN / ED COURSE  As part of my medical decision making, I reviewed the following data within the electronic MEDICAL RECORD NUMBER  22 year old male presenting with above stated history of physical exam secondary to concern for possible lithium toxicity.patient's lithium level I.25. Patient given 2 L IV normal saline resolution of muscle twitching. Patient's advised to discontinue taking lithium for 24 hours. I spoke with Dr. Elna BreslowEappen who concurred with treatment. Patient will follow-up with Dr. Elna BreslowEappen on Monday ____________________________________________  FINAL CLINICAL IMPRESSION(S) / ED DIAGNOSES  Final diagnoses:  Lithium toxicity, accidental or unintentional, initial  encounter     MEDICATIONS GIVEN DURING THIS VISIT:  Medications  sodium chloride 0.9 % bolus 1,000 mL (0 mLs Intravenous Stopped 04/06/18 1111)     ED Discharge Orders    None       Note:  This document was prepared using Dragon voice recognition software and may include unintentional dictation errors.    Darci CurrentBrown, [place] N, MD 04/06/18 [phone]

## 2018-04-06 NOTE — Telephone Encounter (Signed)
Called patient to discuss Brandon SearlesLi level being high ,  Left message to stop Lithium and go to ED to get himself evaluated.

## 2018-04-06 NOTE — ED Notes (Signed)
PTin NAD at time of departure, VSS. PT verbalizes d/c understanding and follow up

## 2018-04-06 NOTE — ED Triage Notes (Signed)
Pt was sent over to r/o possible lithium toxicity, states he has been a having right arm and left shoulder twitching. Denies pain..Marland Kitchen

## 2018-04-06 NOTE — Progress Notes (Signed)
BH MD OP Progress Note  04/06/2018 12:43 PM Brandon Orr  MRN:  [id number]  Chief Complaint: ' I am having some tremors.' Chief Complaint    Follow-up; Medication Problem; Medication Reaction     HPI: Brandon Orr is a 22 year old Caucasian male, student at [employer], originally from [place], presented to the clinic today for a follow-up visit.  [facility] after manic episode.  That was his first psychotic break per medical records.  Patient was seen by writer on 03/21/2018 for an initial evaluation. Pt at that time was on Zyprexa, lithium as well as trazodone.  At that time patient reported side effects to the Zyprexa which caused him excessive fatigue, grogginess during the daytime and reported he did not want to take the medication anymore.  He was already noncompliant with the Zyprexa when he came to Clinical research associatewriter.  Patient hence was advised to continue the lithium as prescribed.  His lithium level on 03/09/2018 prior to discharge was 1.01-therapeutic.  Patient also denied any side effects to the lithium on evaluation that day.  Patient however called the clinic yesterday reporting some tremors of his hands.  Patient hence was advised to get lithium levels done.  His lithium level this morning reported as high at 1.3.  His lithium was collected yesterday afternoon.  Patient reports he continued to take the lithium yesterday evening and also this morning.  On evaluation today patient has coarse tremors of his hands.  He reports this has been ongoing since the past 1 week.  Patient denies any confusion, ataxia, disorientation.  Patient however reports some muscle jerks since the past couple of days.  Hence advised patient that his lithium level may need to be collected as soon as possible and recommended going to the emergency department for evaluation.  Patient agreed with plan.  Visit Diagnosis:    ICD-10-CM   1. Elevated lithium level R79.89   2. Bipolar I  disorder, most recent episode manic, severe with psychotic features (HCC) F31.2   3. Alcohol use disorder, mild, abuse F10.10   4. Coarse tremors G25.2     Past Psychiatric History: Hx of bipolar disorder, recent diagnosis-[facility] March 21.  Patient denies any history of suicide attempts.  Past trials of lithium, Zyprexa, hydroxyzine, trazodone.  Past Medical History:  Past Medical History:  Diagnosis Date  . Bipolar disorder (HCC)   . Depression    History reviewed. No pertinent surgical history.  Family Psychiatric History: Sister-committed suicide.  Paternal grandfather-bipolar disorder.  Mother-depression.  Family History:  Family History  Problem Relation Age of Onset  . Bipolar disorder Paternal Grandfather   . Suicidality Sister    Substance abuse history: Alcohol-mild.  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  He reports he is originally from [place].  Raised by mom and dad.  He had one sister who committed suicide when she was a teenager.  He reports he had a privileged childhood. Social History   Socioeconomic History  . Marital status: Single    Spouse name: Not on file  . Number of children: 0  . Years of education: Not on file  . Highest education level: Some college, no degree  Occupational History  . Not on file  Social Needs  . Financial resource strain: Not hard at all  .  Food insecurity:    Worry: Never true    Inability: Never true  . Transportation needs:    Medical: No    Non-medical: No  Tobacco Use  . Smoking status: Former Games developer  . Smokeless tobacco: Never Used  Substance and Sexual Activity  . Alcohol use: Yes  . Drug use: Not Currently  . Sexual activity: Yes  Lifestyle  . Physical activity:    Days per week: 4 days    Minutes per session: 150+ min  . Stress: Not on file   Relationships  . Social connections:    Talks on phone: Twice a week    Gets together: Never    Attends religious service: Never    Active member of club or organization: No    Attends meetings of clubs or organizations: Never    Relationship status: Never married  Other Topics Concern  . Not on file  Social History Narrative  . Not on file    Allergies: No Known Allergies  Metabolic Disorder Labs: Lab Results  Component Value Date   HGBA1C 4.7 (L) 03/03/2018   MPG 88.19 03/03/2018   No results found for: PROLACTIN Lab Results  Component Value Date   CHOL 161 03/03/2018   TRIG 89 03/03/2018   HDL 65 03/03/2018   CHOLHDL 2.5 03/03/2018   VLDL 18 03/03/2018   LDLCALC 78 03/03/2018   Lab Results  Component Value Date   TSH 1.703 03/03/2018    Therapeutic Level Labs: Lab Results  Component Value Date   LITHIUM 1.25 (H) 04/06/2018   LITHIUM 1.01 03/09/2018   No results found for: VALPROATE No components found for:  CBMZ  Current Medications: Current Outpatient Medications  Medication Sig Dispense Refill  . traZODone (DESYREL) 100 MG tablet Take 1 tablet (100 mg total) by mouth at bedtime as needed for sleep. 30 tablet 1  . lithium carbonate (LITHOBID) 300 MG CR tablet Take 1-2 tablets (300-600 mg total) by mouth 2 (two) times daily. TAKE 1 TABLET IN THE AM AND 2 TABLETS PM 90 tablet 1   No current facility-administered medications for this visit.      Musculoskeletal: Strength & Muscle Tone: within normal limits Gait & Station: normal Patient leans: N/A  Psychiatric Specialty Exam: Review of Systems  Neurological: Positive for tremors.  Psychiatric/Behavioral: The patient is nervous/anxious.   All other systems reviewed and are negative.   Blood pressure 130/84, pulse 84, temperature 98.7 F (37.1 C), temperature source Oral, weight 235 lb 12.8 oz (107 kg).Body mass index is 31.98 kg/m.  General Appearance: Casual  Eye Contact:  Fair  Speech:   Normal Rate  Volume:  Normal  Mood:  Anxious  Affect:  Appropriate  Thought Process:  Goal Directed and Descriptions of Associations: Intact  Orientation:  Full (Time, Place, and Person)  Thought Content: Logical   Suicidal Thoughts:  No  Homicidal Thoughts:  No  Memory:  Immediate;   Fair Recent;   Fair Remote;   Fair  Judgement:  Fair  Insight:  Fair  Psychomotor Activity:  Tremor  Concentration:  Concentration: Fair and Attention Span: Fair  Recall:  [employer] of Knowledge: Fair  Language: Fair  Akathisia:  No  Handed:  Right  AIMS (if indicated): pt with coarse tremors BL hands  Assets:  Communication Skills Desire for Improvement  ADL's:  Intact  Cognition: WNL  Sleep:  Fair   Screenings: AIMS     Admission (Discharged) from 03/02/2018 in [facility] INPATIENT  BEHAVIORAL MEDICINE  AIMS Total Score  0    AUDIT     Admission (Discharged) from 03/02/2018 in [facility] INPATIENT [facility]HAVIORAL MEDICINE  Alcohol Use Disorder Identification Test Final Score (AUDIT)  7       Assessment and Plan: Manan is a 22 year old Caucasian male who has a history of recent diagnosis of bipolar disorder, alcohol use disorder, presented to the clinic today for a follow-up visit.  Patient was recently discharged from [facility] unit on 03/09/2018.  Patient called the clinic yesterday reporting some traumas of his bilateral hands.  Patient had lithium level drawn which came as high.  Patient presented to the clinic today and was advised to go to the emergency department since he continued to take lithium yesterday evening and also this morning.  Patient also reported some side effects of lithium like tremors of bilateral hands as well as muscular jerks since the past 2 days.  Shanda Bumps CMA to escort patient to the emergency department for evaluation of possible lithium toxicity.  Plan For bipolar disorder Patient at this time will stop his lithium.  He will go to the emergency department to be evaluated.   Patient with likely lithium toxicity.  Pt to follow-up in clinic on Monday.  More than 50 % of the time was spent for psychoeducation and supportive psychotherapy and care coordination.  This note was generated in part or whole with voice recognition software. Voice recognition is usually quite accurate but there are transcription errors that can and very often do occur. I apologize for any typographical errors that were not detected and corrected.        Jomarie Longs, MD 04/06/2018, 12:43 PM

## 2018-04-06 NOTE — ED Notes (Signed)
ED Provider at bedside. 

## 2018-04-06 NOTE — Telephone Encounter (Signed)
Discussed pt care with EDP. Will change Lithium to 300 mg qam and 600 mg qpm . Will advise to take Li after 24 hrs since he had side effects and Levels were high. New script sent to pharmacy.

## 2018-04-10 ENCOUNTER — Encounter: Payer: Self-pay | Admitting: Psychiatry

## 2018-04-10 ENCOUNTER — Ambulatory Visit (INDEPENDENT_AMBULATORY_CARE_PROVIDER_SITE_OTHER): Payer: 59 | Admitting: Psychiatry

## 2018-04-10 ENCOUNTER — Other Ambulatory Visit: Payer: Self-pay

## 2018-04-10 VITALS — BP 128/79 | HR 98 | Wt 233.0 lb

## 2018-04-10 DIAGNOSIS — F1010 Alcohol abuse, uncomplicated: Secondary | ICD-10-CM

## 2018-04-10 DIAGNOSIS — F312 Bipolar disorder, current episode manic severe with psychotic features: Secondary | ICD-10-CM

## 2018-04-10 MED ORDER — ARIPIPRAZOLE 2 MG PO TABS: 2.0000 mg | ORAL_TABLET | Freq: Every day | ORAL | 0 refills | Status: AC

## 2018-04-10 NOTE — Patient Instructions (Signed)
Aripiprazole tablets What is this medicine? ARIPIPRAZOLE (ay ri PIP ray zole) is an atypical antipsychotic. It is used to treat schizophrenia and bipolar disorder, also known as manic-depression. It is also used to treat Tourette's disorder and some symptoms of autism. This medicine may also be used in combination with antidepressants to treat major depressive disorder. This medicine may be used for other purposes; ask your health care provider or pharmacist if you have questions. COMMON BRAND NAME(S): Abilify What should I tell my health care provider before I take this medicine? They need to know if you have any of these conditions: -dehydration -dementia -diabetes -heart disease -history of stroke -low blood counts, like low white cell, platelet, or red cell counts -Parkinson's disease -seizures -suicidal thoughts, plans, or attempt; a previous suicide attempt by you or a family member -an unusual or allergic reaction to aripiprazole, other medicines, foods, dyes, or preservatives -pregnant or trying to get pregnant -breast-feeding How should I use this medicine? Take this medicine by mouth with a glass of water. Follow the directions on the prescription label. You can take this medicine with or without food. Take your doses at regular intervals. Do not take your medicine more often than directed. Do not stop taking except on the advice of your doctor or health care professional. A special MedGuide will be given to you by the pharmacist with each prescription and refill. Be sure to read this information carefully each time. Talk to your pediatrician regarding the use of this medicine in children. While this drug may be prescribed for children as young as 6 years of age for selected conditions, precautions do apply. Overdosage: If you think you have taken too much of this medicine contact a poison control center or emergency room at once. NOTE: This medicine is only for you. Do not share  this medicine with others. What if I miss a dose? If you miss a dose, take it as soon as you can. If it is almost time for your next dose, take only that dose. Do not take double or extra doses. What may interact with this medicine? Do not take this medicine with any of the following medications: -brexpiprazole -cisapride -dofetilide -dronedarone -metoclopramide -pimozide -thioridazine This medicine may also interact with the following medications: -alcohol -carbamazepine -certain medicines for anxiety or sleep -certain medicines for blood pressure -certain medicines for fungal infections like ketoconazole, fluconazole, posaconazole, and itraconazole -clarithromycin -fluoxetine -other medicines that prolong the QT interval (cause an abnormal heart rhythm) -paroxetine -quinidine -rifampin This list may not describe all possible interactions. Give your health care provider a list of all the medicines, herbs, non-prescription drugs, or dietary supplements you use. Also tell them if you smoke, drink alcohol, or use illegal drugs. Some items may interact with your medicine. What should I watch for while using this medicine? Visit your doctor or health care professional for regular checks on your progress. It may be several weeks before you see the full effects of this medicine. Do not suddenly stop taking this medicine. You may need to gradually reduce the dose. Patients and their families should watch out for worsening depression or thoughts of suicide. Also watch out for sudden changes in feelings such as feeling anxious, agitated, panicky, irritable, hostile, aggressive, impulsive, severely restless, overly excited and hyperactive, or not being able to sleep. If this happens, especially at the beginning of antidepressant treatment or after a change in dose, call your health care professional. You may get dizzy or drowsy. Do   not drive, use machinery, or do anything that needs mental  alertness until you know how this medicine affects you. Do not stand or sit up quickly, especially if you are an older patient. This reduces the risk of dizzy or fainting spells. Alcohol can increase dizziness and drowsiness. Avoid alcoholic drinks. This medicine can reduce the response of your body to heat or cold. Dress warm in cold weather and stay hydrated in hot weather. If possible, avoid extreme temperatures like saunas, hot tubs, very hot or cold showers, or activities that can cause dehydration such as vigorous exercise. This medicine may cause dry eyes and blurred vision. If you wear contact lenses you may feel some discomfort. Lubricating drops may help. See your eye doctor if the problem does not go away or is severe. If you notice an increased hunger or thirst, different from your normal hunger or thirst, or if you find that you have to urinate more frequently, you should contact your health care provider as soon as possible. You may need to have your blood sugar monitored. This medicine may cause changes in your blood sugar levels. You should monitor you blood sugar frequently if you have diabetes. There have been reports of uncontrollable and strong urges to gamble, binge eat, shop, and have sex while taking this medicine. If you experience any of these or other uncontrollable and strong urges while taking this medicine, you should report it to your health care provider as soon as possible. What side effects may I notice from receiving this medicine? Side effects that you should report to your doctor or health care professional as soon as possible: -allergic reactions like skin rash, itching or hives, swelling of the face, lips, or tongue -breathing problems -confusion -feeling faint or lightheaded, falls -fever or chills, sore throat -increased hunger or thirst -increased urination -joint pain -muscles pain, spasms -problems with balance, talking, walking -restlessness or need to  keep moving -seizures -suicidal thoughts or other mood changes -trouble swallowing -uncontrollable and excessive urges (examples: gambling, binge eating, shopping, having sex) -uncontrollable head, mouth, neck, arm, or leg movements -unusually weak or tired Side effects that usually do not require medical attention (report to your doctor or health care professional if they continue or are bothersome): -blurred vision -constipation -headache -nausea, vomiting -trouble sleeping -weight gain This list may not describe all possible side effects. Call your doctor for medical advice about side effects. You may report side effects to FDA at 1-800-FDA-1088. Where should I keep my medicine? Keep out of the reach of children. Store at room temperature between 15 and 30 degrees C (59 and 86 degrees F). Throw away any unused medicine after the expiration date. NOTE: This sheet is a summary. It may not cover all possible information. If you have questions about this medicine, talk to your doctor, pharmacist, or health care provider.  2018 Elsevier/Gold Standard (2016-11-21 11:45:05)  

## 2018-04-10 NOTE — Telephone Encounter (Signed)
thanks

## 2018-04-10 NOTE — Telephone Encounter (Signed)
Pt was called and he made an appt for 2:00 today.

## 2018-04-10 NOTE — Progress Notes (Signed)
BH MD OP Progress Note  04/10/2018 2:52 PM Brandon Orr  MRN:  [id number]  Chief Complaint: ' I am here for follow up."  Chief Complaint    Follow-up; Medication Refill     HPI: Brandon Orr is a 22 year old Caucasian male, student at [employer], originally from [place], presented to the clinic today for a follow-up visit.  Patient today presented for a follow-up visit.  Patient had to be sent to the emergency department on 04/06/2018 after he was found to have toxic level of lithium.  Patient however was treated for dehydration and was sent back from the emergency department.  Patient was advised to follow-up in clinic today.  Patient was also started on a lower dose of lithium the same day.  Patient today reports he got confused about the lithium dose and has been taking 300 mg twice a day and not 900 mg as intstructed.  Advised patient to start taking the dose as prescribed starting today.  Also gave him a lab slip to get his lithium level drawn 5 days from now.  Patient continues to have some restlessness at night.  He reports he continues to have sleep issues.  He has been taking the trazodone as needed.  He reports he does not like to take it every night since he does not want to be dependent on it.  He also reports he takes it at around 10:45 pm at night and he reports he feels groggy in the morning for at least 2 hours after he wakes up.  Discussed other sleep medications with patient.  Discussed hydroxyzine as needed.  Patient reports he does not want to make any medication changes and for now wants to stick with his trazodone.  Patient reports he has been feeling depressed and sad about his situation.  He reports he feels isolated from all his friends since after his most recent inpatient admission and also some legal issues that he was accused of at the [facility].  He reports he was found not guilty and does not have to face any legal charges however he reports that still made his  friends to isolate him.  He does report some death wish however reports he would never act on it since he reports everybody eventually dies and he does not know why he should kill himself.  He also reports good social support from his family who calls him twice every day.  He is also in psychotherapy with therapist Lonia Chimera at [phone].  I got his consent and contacted his therapist on the phone.  Discussed plan of care as well as his recent lithium toxicity and med changes with therapist.  Patient will follow-up with Ms. Raynelle Fanning Paschal this evening.  He continues to have psychosocial stressors of being in school, final exams of the semester coming up in 3 weeks and so on.  Visit Diagnosis:    ICD-10-CM   1. Bipolar I disorder, most recent episode manic, severe with psychotic features (HCC) F31.2   2. Alcohol use disorder, mild, abuse F10.10     Past Psychiatric History: Hx of bipolar disorder, recent diagnosis-admitted at [facility] inpatient unit from March 14 - March 21.  Patient denies any history of suicide attempts.  Past trials of lithium, Zyprexa, Vistaril, trazodone.  Past Medical History:  Past Medical History:  Diagnosis Date  . Bipolar disorder (HCC)   . Depression    History reviewed. No pertinent surgical history.  Family Psychiatric History: Sister -committed suicide.  Paternal grandfather-bipolar disorder.  Mother-depression  Family History:  Family History  Problem Relation Age of Onset  . Bipolar disorder Paternal Grandfather   . Suicidality Sister     Social History: Patient is a Consulting civil engineer at [employer], Glass blower/designer in Audiological scientist, Warden/ranger.  He reports he is originally from [place].  Raised by both mother and father.  He had one sister who committed suicide when he was 22 years old.  Social History   Socioeconomic History  . Marital status: Single    Spouse name: Not on file  . Number of children: 0  . Years of education: Not on file  . Highest  education level: Some college, no degree  Occupational History  . Not on file  Social Needs  . Financial resource strain: Not hard at all  . Food insecurity:    Worry: Never true    Inability: Never true  . Transportation needs:    Medical: No    Non-medical: No  Tobacco Use  . Smoking status: Former Games developer  . Smokeless tobacco: Never Used  Substance and Sexual Activity  . Alcohol use: Yes  . Drug use: Not Currently  . Sexual activity: Yes  Lifestyle  . Physical activity:    Days per week: 4 days    Minutes per session: 150+ min  . Stress: Not on file  Relationships  . Social connections:    Talks on phone: Twice a week    Gets together: Never    Attends religious service: Never    Active member of club or organization: No    Attends meetings of clubs or organizations: Never    Relationship status: Never married  Other Topics Concern  . Not on file  Social History Narrative  . Not on file    Allergies: No Known Allergies  Metabolic Disorder Labs: Lab Results  Component Value Date   HGBA1C 4.7 (L) 03/03/2018   MPG 88.19 03/03/2018   No results found for: PROLACTIN Lab Results  Component Value Date   CHOL 161 03/03/2018   TRIG 89 03/03/2018   HDL 65 03/03/2018   CHOLHDL 2.5 03/03/2018   VLDL 18 03/03/2018   LDLCALC 78 03/03/2018   Lab Results  Component Value Date   TSH 1.703 03/03/2018    Therapeutic Level Labs: Lab Results  Component Value Date   LITHIUM 1.25 (H) 04/06/2018   LITHIUM 1.3 (H) 04/05/2018   No results found for: VALPROATE No components found for:  CBMZ  Current Medications: Current Outpatient Medications  Medication Sig Dispense Refill  . lithium carbonate (LITHOBID) 300 MG CR tablet Take 1-2 tablets (300-600 mg total) by mouth 2 (two) times daily. TAKE 1 TABLET IN THE AM AND 2 TABLETS PM 90 tablet 1  . traZODone (DESYREL) 100 MG tablet Take 1 tablet (100 mg total) by mouth at bedtime as needed for sleep. 30 tablet 1  .  ARIPiprazole (ABILIFY) 2 MG tablet Take 1 tablet (2 mg total) by mouth at bedtime. 30 tablet 0   No current facility-administered medications for this visit.      Musculoskeletal: Strength & Muscle Tone: within normal limits Gait & Station: normal Patient leans: N/A  Psychiatric Specialty Exam: Review of Systems  Psychiatric/Behavioral: Positive for depression. The patient has insomnia.   All other systems reviewed and are negative.   Blood pressure 128/79, pulse 98, weight 233 lb (105.7 kg).Body mass index is 31.6 kg/m.  General Appearance: Casual  Eye Contact:  Fair  Speech:  Clear and Coherent  Volume:  Normal  Mood:  Dysphoric  Affect:  Congruent  Thought Process:  Goal Directed and Descriptions of Associations: Intact  Orientation:  Full (Time, Place, and Person)  Thought Content: Logical   Suicidal Thoughts:  No  Homicidal Thoughts:  No  Memory:  Immediate;   Fair Recent;   Fair Remote;   Fair  Judgement:  Fair  Insight:  Fair  Psychomotor Activity:  Normal  Concentration:  Concentration: Fair and Attention Span: Fair  Recall:  FiservFair  Fund of Knowledge: Fair  Language: Fair  Akathisia:  No  Handed:  Right  AIMS (if indicated): does have some tremors from his recent Li level being high, improving  Assets:  Manufacturing systems engineerCommunication Skills Desire for Improvement Social Support  ADL's:  Intact  Cognition: WNL  Sleep:  restless   Screenings: AIMS     Admission [facility]Discharged) from [facility]02/2018 [facility] MED[facility]INE  AIMS Total Score  0    AUDIT     Admission (Discharged[facility]02[facility]  Alcohol Use Disorder Identification Test Final Score (AUDIT)  7       Assessment and Plan: Brandon Orr is a 22 year old Caucasian male who has a history of recent diagnosis of bipolar disorder, alcohol use disorder, presented to the clinic today for a follow-up visit.  Pt [facility].  Patient  recently had an emergency department visit after his lithium became toxic.  He was rehydrated and his lithium level was reduced.  Patient today presented for another follow-up after the lithium abnormality recently.  Patient continues to be depressed and also has psychosocial stressors of being socially isolated, being in school, his final exams coming up and so on.  Patient however appears to be motivated to make medication readjustment as well as stay in psychotherapy with therapist Lonia ChimeraJulie Paschal.  Patient reports he has an upcoming appointment with therapist this evening.  I have also reached out to Ms. Lonia ChimeraJulie Paschal and have discussed patient care and coordinated care.  Plan For bipolar disorder Continue lithium 300 mg every morning and 600 mg every afternoon. Provided lab slip to get another lithium level in 5 days. Add Abilify 2 mg p.o. nightly to augment the lithium since patient has some depressive symptoms.  Discussed with patient to continue psychotherapy with Ms. Lonia ChimeraJulie Paschal.  Patient also continues to have good social support from his family.  For insomnia Continue trazodone 50-100 mg p.o. nightly as needed Discussed adding another medication, however patient is not ready yet.  Follow-up in clinic in 1 week or sooner if needed.  More than 50 % of the time was spent for psychoeducation and supportive psychotherapy and care coordination.  This note was generated in part or whole with voice recognition software. Voice recognition is usually quite accurate but there are transcription errors that can and very often do occur. I apologize for any typographical errors that were not detected and corrected.       Jomarie LongsSaramma Tiegan Jambor, MD 04/10/2018, 8:17 PM

## 2018-04-11 ENCOUNTER — Ambulatory Visit: Payer: 59 | Admitting: Psychiatry

## 2018-04-17 ENCOUNTER — Encounter: Payer: Self-pay | Admitting: Psychiatry

## 2018-04-17 ENCOUNTER — Other Ambulatory Visit: Payer: Self-pay

## 2018-04-17 ENCOUNTER — Ambulatory Visit (INDEPENDENT_AMBULATORY_CARE_PROVIDER_SITE_OTHER): Payer: 59 | Admitting: Psychiatry

## 2018-04-17 VITALS — BP 142/91 | HR 68 | Temp 98.4°F | Wt 228.2 lb

## 2018-04-17 DIAGNOSIS — F312 Bipolar disorder, current episode manic severe with psychotic features: Secondary | ICD-10-CM

## 2018-04-17 DIAGNOSIS — F1010 Alcohol abuse, uncomplicated: Secondary | ICD-10-CM | POA: Diagnosis not present

## 2018-04-17 NOTE — Progress Notes (Signed)
BH MD OP Progress Note  04/17/2018 3:37 PM Brandon Orr  MRN:  [id number]  Chief Complaint: ' I am here for follow up.' Chief Complaint    Follow-up; Medication Refill     HPI: Brandon Orr is a 22 year old Caucasian male, student at [employer], originally from [place], presented to the clinic today for a follow-up visit.  Today reports he is doing okay.  He reports he did not take the Abilify as prescribed.  He reports he was worried about being on antipsychotic and was worried about all the side effects that can cause.  He reports he continues to take the lithium, reduced dose as prescribed.  He reports his tremors have improved and on examination very fine tremors were noted unilaterally on his right upper extremities.  Patient reports he sleeps okay.  Patient reports he continues to cope with chronic suicidal thoughts at times and affirms that he will never act on it . He reports family as his major positive factor. He is future oriented and wants to complete school. Continues to be in psychotherapy with therapist Lonia Chimera.  He reports he sees her on a weekly basis.  He reports he is trying to get good grades for his upcoming exams in 2 weeks.  He has 3 more weeks to go and he will be done with the semester.  He reports he wants to do his best so that he can complete and go back home.  He does report he has tutoring available.  He reports he continues to be socially isolated.  He reports there were some rumors at school about him and that is preventing him from making new friends.  He however reports he is okay with that and has not been trying too hard to make any changes in that area.  He reports he had to sent his pet dog back home when all his problems initially started.  He reports his dog was categorized as a dangerous breed and hence he could not keep it with him.  Discussed getting another pet dog for therapeutic benefits however he declined ,said it may be too expensive to  afford another pet at this time.  He reports he is okay with the way things are and does not want to make big changes at this point.  He reports he looks forward to completing his course and would like to pursue higher studies in [place] which is his long-term plan.  He reports his family as a positive factor in his life.  Continues to use alcohol occasionally but denies binging on it or having any withdrawal symptoms .   Visit Diagnosis:    ICD-10-CM   1. Bipolar I disorder, most recent episode manic, severe with psychotic features (HCC) F31.2   2. Alcohol use disorder, mild, abuse F10.10     Past Psychiatric History: I have reviewed past psychiatric history from my progress note on 04/10/2018.  Past trials of lithium, Zyprexa, Vistaril, trazodone, Abilify-prescribed but patient never took it .  Past Medical History:  Past Medical History:  Diagnosis Date  . Bipolar disorder (HCC)   . Depression    History reviewed. No pertinent surgical history.  Family Psychiatric History: Reviewed family psychiatric history from my progress note on 04/10/2018.  Family History:  Family History  Problem Relation Age of Onset  . Bipolar disorder Paternal Grandfather   . Suicidality Sister    Substance abuse history: Alcohol use occasional-denies any recent binging  Social History: Patient is Consulting civil engineer  at [employer], majoring in Audiological scientist, Warden/ranger.  He reports he is originally from [place].  Raised by both mother and father.  He had one sister who committed suicide when he was 22 years old. Social History   Socioeconomic History  . Marital status: Single    Spouse name: Not on file  . Number of children: 0  . Years of education: Not on file  . Highest education level: Some college, no degree  Occupational History  . Not on file  Social Needs  . Financial resource strain: Not hard at all  . Food insecurity:    Worry: Never true    Inability: Never true  . Transportation needs:     Medical: No    Non-medical: No  Tobacco Use  . Smoking status: Former Games developer  . Smokeless tobacco: Never Used  Substance and Sexual Activity  . Alcohol use: Yes  . Drug use: Not Currently  . Sexual activity: Yes  Lifestyle  . Physical activity:    Days per week: 4 days    Minutes per session: 150+ min  . Stress: Not on file  Relationships  . Social connections:    Talks on phone: Twice a week    Gets together: Never    Attends religious service: Never    Active member of club or organization: No    Attends meetings of clubs or organizations: Never    Relationship status: Never married  Other Topics Concern  . Not on file  Social History Narrative  . Not on file    Allergies: No Known Allergies  Metabolic Disorder Labs: Lab Results  Component Value Date   HGBA1C 4.7 (L) 03/03/2018   MPG 88.19 03/03/2018   No results found for: PROLACTIN Lab Results  Component Value Date   CHOL 161 03/03/2018   TRIG 89 03/03/2018   HDL 65 03/03/2018   CHOLHDL 2.5 03/03/2018   VLDL 18 03/03/2018   LDLCALC 78 03/03/2018   Lab Results  Component Value Date   TSH 1.703 03/03/2018    Therapeutic Level Labs: Lab Results  Component Value Date   LITHIUM 1.25 (H) 04/06/2018   LITHIUM 1.3 (H) 04/05/2018   No results found for: VALPROATE No components found for:  CBMZ  Current Medications: Current Outpatient Medications  Medication Sig Dispense Refill  . ARIPiprazole (ABILIFY) 2 MG tablet Take 1 tablet (2 mg total) by mouth at bedtime. 30 tablet 0  . lithium carbonate (LITHOBID) 300 MG CR tablet Take 1-2 tablets (300-600 mg total) by mouth 2 (two) times daily. TAKE 1 TABLET IN THE AM AND 2 TABLETS PM 90 tablet 1  . traZODone (DESYREL) 100 MG tablet Take 1 tablet (100 mg total) by mouth at bedtime as needed for sleep. 30 tablet 1   No current facility-administered medications for this visit.      Musculoskeletal: Strength & Muscle Tone: within normal limits Gait &  Station: normal Patient leans: N/A  Psychiatric Specialty Exam: Review of Systems  Psychiatric/Behavioral: Positive for depression.  All other systems reviewed and are negative.   Blood pressure (!) 142/91, pulse 68, temperature 98.4 F (36.9 C), temperature source Oral, weight 228 lb 3.2 oz (103.5 kg).Body mass index is 30.95 kg/m.  General Appearance: Casual  Eye Contact:  Fair  Speech:  Clear and Coherent  Volume:  Normal  Mood:  Dysphoric  Affect:  Congruent  Thought Process:  Goal Directed and Descriptions of Associations: Intact  Orientation:  Full (Time, Place, and  Person)  Thought Content: Logical   Suicidal Thoughts:  No  Homicidal Thoughts:  No  Memory:  Immediate;   Fair Recent;   Fair Remote;   Fair  Judgement:  Fair  Insight:  Fair  Psychomotor Activity:  Normal  Concentration:  Concentration: Fair and Attention Span: Fair  Recall:  [employer] of Knowledge: Fair  Language: Fair  Akathisia:  No  Handed:  Right  AIMS (if indicated:0  Assets:  Communication Skills Desire for Improvement Housing Talents/Skills Transportation  ADL's:  Intact  Cognition: WNL  Sleep:  Fair   Screenings: AIMS     Admission (Discharged) from 03/02/2018 in [facility] INPATIENT BEHAVIORAL MEDICINE  AIMS Total Score  0    AUDIT     Admission (Discharged) from 03/02/2018 in [facility] INPATIENT BEHAVIORAL MEDICINE  Alcohol Use Disorder Identification Test Final Score (AUDIT)  7       Assessment and Plan: Adrick is a 22 year old Caucasian male who has a history of recent diagnosis of bipolar disorder, alcohol use disorder, presented to the clinic today for a follow-up visit.  Patient recently had lithium levels to high and hence his lithium dosage had to be reduced.  Provided him with lab slip to get another lithium level and he has not yet completed it.  He continues to be in psychotherapy with therapist Lonia Chimera.  He continues to have psychosocial stressors of being socially isolated,  being in school, final exams coming up for the semester and so on.  He however reports he has been motivated to stay focused ,appears to be future oriented and continues to stay compliant with therapy and medication management appointments.  Plan as noted below.  Plan Bipolar disorder Continue lithium 300 mg every morning and 600 mg every afternoon. Provided lab slip to get another Dierdre Searles level done.  He has not yet gotten it.  He reports he will get it ASAP. Abilify was prescribed last visit to augment the lithium however patient refuses to take it.  He reports he is worried about all the side effects of being on antipsychotic medication.  Some time was spent providing medication education.  Continue psychotherapy with Ms. Lonia Chimera.  For insomnia Continue trazodone 50-100 mg p.o. nightly as needed  Follow-up in clinic in 1 week or sooner if needed.  More than 50 % of the time was spent for psychoeducation and supportive psychotherapy and care coordination.  This note was generated in part or whole with voice recognition software. Voice recognition is usually quite accurate but there are transcription errors that can and very often do occur. I apologize for any typographical errors that were not detected and corrected.       Jomarie Longs, MD 04/17/2018, 3:37 PM

## 2018-04-25 ENCOUNTER — Telehealth: Payer: Self-pay

## 2018-04-25 ENCOUNTER — Ambulatory Visit: Payer: 59 | Admitting: Psychiatry

## 2018-04-25 NOTE — Telephone Encounter (Signed)
tried to call patient but his mother answered and there is no signed relase to speak with her so I told her I would call back.

## 2018-04-25 NOTE — Telephone Encounter (Signed)
Ok thanks 

## 2020-12-29 ENCOUNTER — Other Ambulatory Visit: Payer: Self-pay | Admitting: Chiropractor

## 2021-02-08 ENCOUNTER — Emergency Department
Admission: EM | Admit: 2021-02-08 | Discharge: 2021-02-08 | Disposition: A | Discharge status: Home / Self Care | Payer: No Typology Code available for payment source | Attending: Emergency Medicine | Admitting: Emergency Medicine

## 2021-02-08 ENCOUNTER — Telehealth (HOSPITAL_BASED_OUTPATIENT_CLINIC_OR_DEPARTMENT_OTHER): Payer: Self-pay

## 2021-02-08 DIAGNOSIS — F319 Bipolar disorder, unspecified: Secondary | ICD-10-CM | POA: Insufficient documentation

## 2021-02-08 DIAGNOSIS — F39 Unspecified mood [affective] disorder: Secondary | ICD-10-CM

## 2021-02-08 DIAGNOSIS — Z20822 Contact with and (suspected) exposure to covid-19: Secondary | ICD-10-CM | POA: Insufficient documentation

## 2021-02-08 HISTORY — DX: Bipolar disorder, unspecified: F31.9

## 2021-02-08 LAB — COMPREHENSIVE METABOLIC PANEL
ALT: 25 U/L (ref 0–55)
AST (SGOT): 32 U/L (ref 5–34)
Albumin/Globulin Ratio: 1.2 (ref 0.9–2.2)
Albumin: 4.6 g/dL (ref 3.5–5.0)
Alkaline Phosphatase: 78 U/L (ref 38–106)
Anion Gap: 14 (ref 5.0–15.0)
BUN: 16 mg/dL (ref 9.0–28.0)
Bilirubin, Total: 0.7 mg/dL (ref 0.2–1.2)
CO2: 20 mEq/L — ABNORMAL LOW (ref 22–29)
Calcium: 10.2 mg/dL (ref 8.5–10.5)
Chloride: 104 mEq/L (ref 100–111)
Creatinine: 0.9 mg/dL (ref 0.7–1.3)
Globulin: 3.7 g/dL — ABNORMAL HIGH (ref 2.0–3.6)
Glucose: 101 mg/dL — ABNORMAL HIGH (ref 70–100)
Potassium: 4 mEq/L (ref 3.5–5.1)
Protein, Total: 8.3 g/dL (ref 6.0–8.3)
Sodium: 138 mEq/L (ref 136–145)

## 2021-02-08 LAB — ECG 12-LEAD
Atrial Rate: 67 {beats}/min
Atrial Rate: 76 {beats}/min
P Axis: 24 degrees
P Axis: 35 degrees
P-R Interval: 124 ms
P-R Interval: 146 ms
Q-T Interval: 366 ms
Q-T Interval: 376 ms
QRS Duration: 106 ms
QRS Duration: 110 ms
QTC Calculation (Bezet): 397 ms
QTC Calculation (Bezet): 411 ms
R Axis: 30 degrees
R Axis: 35 degrees
T Axis: 18 degrees
T Axis: 22 degrees
Ventricular Rate: 67 {beats}/min
Ventricular Rate: 76 {beats}/min

## 2021-02-08 LAB — RAPID DRUG SCREEN, URINE
Barbiturate Screen, UR: NEGATIVE
Benzodiazepine Screen, UR: NEGATIVE
Cannabinoid Screen, UR: NEGATIVE
Cocaine, UR: NEGATIVE
Opiate Screen, UR: NEGATIVE
PCP Screen, UR: NEGATIVE
Urine Amphetamine Screen: NEGATIVE

## 2021-02-08 LAB — CBC AND DIFFERENTIAL
Absolute NRBC: 0 10*3/uL (ref 0.00–0.00)
Basophils Absolute Automated: 0.05 10*3/uL (ref 0.00–0.08)
Basophils Automated: 0.5 %
Eosinophils Absolute Automated: 0.07 10*3/uL (ref 0.00–0.44)
Eosinophils Automated: 0.8 %
Hematocrit: 48.1 % (ref 37.6–49.6)
Hgb: 16.5 g/dL (ref 12.5–17.1)
Immature Granulocytes Absolute: 0.02 10*3/uL (ref 0.00–0.07)
Immature Granulocytes: 0.2 %
Lymphocytes Absolute Automated: 2.51 10*3/uL (ref 0.42–3.22)
Lymphocytes Automated: 27 %
MCH: 30.4 pg (ref 25.1–33.5)
MCHC: 34.3 g/dL (ref 31.5–35.8)
MCV: 88.7 fL (ref 78.0–96.0)
MPV: 10.8 fL (ref 8.9–12.5)
Monocytes Absolute Automated: 0.91 10*3/uL — ABNORMAL HIGH (ref 0.21–0.85)
Monocytes: 9.8 %
Neutrophils Absolute: 5.72 10*3/uL (ref 1.10–6.33)
Neutrophils: 61.7 %
Nucleated RBC: 0 /100 WBC (ref 0.0–0.0)
Platelets: 293 10*3/uL (ref 142–346)
RBC: 5.42 10*6/uL (ref 4.20–5.90)
RDW: 13 % (ref 11–15)
WBC: 9.28 10*3/uL (ref 3.10–9.50)

## 2021-02-08 LAB — COVID-19 (SARS-COV-2): SARS CoV-2: NEGATIVE

## 2021-02-08 LAB — ACETAMINOPHEN LEVEL: Acetaminophen Level: <7 ug/mL — ABNORMAL LOW (ref 10–30)

## 2021-02-08 LAB — TSH: TSH: 1.32 u[IU]/mL (ref 0.35–4.94)

## 2021-02-08 LAB — LITHIUM LEVEL: Lithium Level: <0.1 mEq/L — ABNORMAL LOW (ref 1.0–1.2)

## 2021-02-08 LAB — SALICYLATE LEVEL: Salicylate Level: <5 mg/dL — ABNORMAL LOW (ref 15.0–30.0)

## 2021-02-08 LAB — GFR: EGFR: >60

## 2021-02-08 LAB — ETHANOL: Alcohol: NOT DETECTED mg/dL

## 2021-02-08 NOTE — ED Notes (Addendum)
Patient brought in by mother concerned for possible bipolar/manic episode.Pt and pt mother state pt had a manic episode and was dx with bipolar disorder x3 years ago. Pt was placed on lithium along with other medications during hospitalization. Pt states after being d/c, he felt that his relationship with his psychologist was poor and that the medications he was placed on made him to where he couldn't think. Pt describes that prior to his first manic episode he was at his best academically while in college. Pt is unable to really describe what brought him to the conclusion he was having another manic episode. Pt does state he feels like it started this past weekend. Mother states that pt has been extremely emotional and has been having auditory and visual hallucinations. Pt does admit at this time he has been hearing what he describes as "speakers going off" and "flashes of light".

## 2021-02-08 NOTE — Discharge Instructions (Signed)
Support Groups    Treatment Centers    Psychiatrists    Teletherapy    Therapists                  Find a Therapist              Scan the following QR with your smartphone camera or app to be directed to Psychology Todays listings of therapists in your area who accept your insurance. At the top of the page that opens, you can select filters to further refine your search. You can always start a new search by visiting:   [email or web address]        Scan the code above for therapists practicing near [place], [place]  and work with clients on Bipolar Disorder     Looking for other support? Try the following as well:   Teletherapy -[email or web address].[email or web address]/us/therapists/online-counseling   Psychiatrists - [email or web address]/[email or web address]   Treatment Centers - https://www.psychologytoday.com/us/treatment-rehab   Support Groups - https://www.psychologytoday.com/us/groups   Betterhelp.com  or   Goodtherapy.org      Dear [name]:    Thank you for choosing the [facility] Emergency Department, the premier emergency department in the [place] area.  I hope your visit today was EXCELLENT. You will receive a survey via text message that will give you the opportunity to provide feedback to your team about your visit. Please do not hesitate to reach out with any questions!    Specific instructions for your visit today:        IF YOU DO NOT CONTINUE TO IMPROVE OR YOUR CONDITION WORSENS, PLEASE CONTACT YOUR DOCTOR OR RETURN IMMEDIATELY TO THE EMERGENCY DEPARTMENT.    Sincerely,  Tarin Johndrow, Louanne Belton., MD  Attending Emergency Physician  [facility] Emergency Department    ONSITE PHARMACY  Our full service onsite pharmacy is located in the ER waiting room.  Open 7 days a week from 9 am to 9 pm.  We accept all major insurances and prices are competitive with major retailers.  Ask your provider to print your prescriptions down to the pharmacy to speed you on your way  home.    OBTAINING A PRIMARY CARE APPOINTMENT    Primary care physicians (PCPs, also known as primary care doctors) are either internists or family medicine doctors. Both types of PCPs focus on health promotion, disease prevention, patient education and counseling, and treatment of acute and chronic medical conditions.    If you need a primary care doctor, please call the below number and ask who is receiving new patients.     [facility]  Telephone:  [phone]  [email or web address]    DOCTOR REFERRALS  Call [phone] (available 24 hours a day, 7 days a week) if you need any further referrals and we can help you find a primary care doctor or specialist.  Also, available online at:  [email or web address]    YOUR CONTACT INFORMATION  Before leaving please check with registration to make sure we have an up-to-date contact number.  You can call registration at [phone] to update your information.  For questions about your hospital bill, please call [phone].  For questions about your Emergency Dept Physician bill please call [phone].      FREE HEALTH SERVICES  If you need help with health or social services, please call 2-1-1 for a free referral to resources in your area.  2-1-1 is a free service connecting people with information on health insurance, free clinics, pregnancy, mental health, dental care, food assistance,  housing, and substance abuse counseling.  Also, available online at:  http://www.[facility].org    MEDICAL RECORDS AND TESTS  Certain laboratory test results do not come back the same day, for example urine cultures.   We will contact you if other important findings are noted.  Radiology films are often reviewed again to ensure accuracy.  If there is any discrepancy, we will notify you.      Please call [phone] to pick up a complimentary CD of any radiology studies performed.  If you or your doctor would like to request a copy of your medical records,  please call [phone].      ORTHOPEDIC INJURY   Please know that significant injuries can exist even when an initial x-ray is read as normal or negative.  This can occur because some fractures (broken bones) are not initially visible on x-rays.  For this reason, close outpatient follow-up with your primary care doctor or bone specialist (orthopedist) is required.    MEDICATIONS AND FOLLOWUP  Please be aware that some prescription medications can cause drowsiness.  Use caution when driving or operating machinery.    The examination and treatment you have received in our Emergency Department is provided on an emergency basis, and is not intended to be a substitute for your primary care physician.  It is important that your doctor checks you again and that you report any new or remaining problems at that time.      24 HOUR PHARMACIES  The nearest 24 hour pharmacy is:    [facility]  [address]  [place], [place] [address]  [phone]      ASSISTANCE WITH INSURANCE    Affordable Care Act  [facility])  Call to start or finish an application, compare plans, enroll or ask a question.  [phone]  TTY: [phone]  Web:  Healthcare.gov    Help Enrolling in [facility]  [employer] [place]  [phone] (TOLL-FREE)  352-877-6417 (TTY)  Web:  Http://www.coverva.org    Local Help Enrolling in the [facility]  [place] [facility]  [phone] (MAIN)  Email:  health-help@nvfs .org  Web:  BlackjackMyths.is  Address:  [address], [address] [place], [place] [address]    SEDATING MEDICATIONS  Sedating medications include strong pain medications (e.g. narcotics), muscle relaxers, benzodiazepines (used for anxiety and as muscle relaxers), Benadryl/diphenhydramine and other antihistamines for allergic reactions/itching, and other medications.  If you are unsure if you have received a sedating medication, please ask your physician or nurse.  If you received a sedating medication: DO NOT drive a car.  DO NOT operate machinery. DO NOT perform jobs where you need to be alert.  DO NOT drink alcoholic beverages while taking this medicine.     If you get dizzy, sit or lie down at the first signs. Be careful going up and down stairs.  Be extra careful to prevent falls.     Never give this medicine to others.     Keep this medicine out of reach of children.     Do not take or save old medicines. Throw them away when outdated.     Keep all medicines in a cool, dry place. DO NOT keep them in your bathroom medicine cabinet or in a cabinet above the stove.    MEDICATION REFILLS  Please be aware that we cannot refill any prescriptions through the ER. If you need further treatment from what is provided at your ER visit, please follow up with your  primary care doctor or your pain management specialist.    FREESTANDING EMERGENCY DEPARTMENTS OF [facility]  Did you know [facility] has two freestanding ERs located just a few miles away?   ER of [place] and [address] ER of [place] have short wait times, easy free parking directly in front of the building and top patient satisfaction scores - and the same Board Certified Emergency Medicine doctors as [facility].            [facility]) Referral  [address], [place], [place] [address]  Hours: Monday - Friday (8am - 4pm)  P: [phone]    At this time, [facility] has moved to mostly appointment-based tele-video evaluations for either same day or next day slots with some flexibility for walk-ins. To be scheduled, patients need to call the call center [phone] get transferred to the front desk to verify registration; and then complete a phone triage screen with a clinician.      Walk-in Clinic for Psych Appointments: Not available at this time.

## 2021-02-08 NOTE — ED Provider Notes (Signed)
[facility] EMERGENCY DEPARTMENT H&P      Visit date: 02/08/2021      CLINICAL SUMMARY           Diagnosis:    .     Final diagnoses:   Bipolar affective disorder, remission status unspecified       MDM Notes:      Based on the patient's history evaluation sounds like an exacerbation of his psychiatric issues.  However, patient appears stable at this time.  Is not manic, does not appear psychotic, denies SI/HI.  Multiple studies were obtained which were nondiagnostic, patient was medically cleared for psych.  Patient was seen by psych liaison, does not meet criteria for inpatient admission.  Given outpatient resources.  Patient is being discharged stable condition.         Disposition:         Discharge               Discharge Prescriptions     None                     CLINICAL INFORMATION        HPI:      Chief Complaint: Psychiatric Evaluation  .    Walter Villarreal is a 25 y.o. male w/PMHx of Bipolar 1 disorder, who presents for psychiatric evaluation. Pt says that he has not been sleeping well recently. This was his main complaint.     Pt's mother states that pt has experienced a few very emotional episodes today as well as some hallucinations. Mom notes that this has happened once before 3 years ago. Pt has been seen by a psychiatrist in the community who prescribed some medications. However, mom says pt had a poor experience and discontinued taking the medications. Pt has been managing since that point. Mom states that they assumed it was a one-time episode, but she believes that pt is experiencing a bipolar manic episode. She says that they have not been able to get lithium.   Pt reports occasional etoh considering where he is employed, and he has stopped smoking.     History obtained from: patient, parent, review of prior chart, nurse      ROS:      Positive and negative ROS elements as per HPI.  All other systems reviewed and negative.      Physical Exam:      Pulse (!) 106    BP 144/87   Resp 20   SpO2 100 %   Temp 99 F (37.2 C)    Constitutional: healthy appearing male, laying on a gurney, in no acute distress   Head: Normocephalic, atraumatic  Eyes: EOMI.  Sclera not pale, injected.  ENT: Mucous membranes moist.  Nose symmetric.  Neck: Normal range of motion. Non-tender.  Respiratory/Chest: Clear to auscultation. No respiratory distress.   Cardiovascular: RRR w/o murmur/gallop  Abdomen: Soft and non-tender. No guarding. No masses or hepatosplenomegaly.  Musculoskeletal: No edema. No cyanosis.  Neurological: CN II-XII intact grossly. No focal motor deficits by observation. Speech normal.  Skin: Warm and dry. No rash.  Psychiatric: Normal affect. Normal concentration.            PAST HISTORY        Primary Care Provider: No primary care provider on file.        PMH/PSH:    .     Past Medical History:   Diagnosis Date    Bipolar 1 disorder  He has a past surgical history that includes TONSILLECTOMY, ADENOIDECTOMY.      Social/Family History:      He reports that he has never smoked. He has never used smokeless tobacco. He reports previous alcohol use. He reports previous drug use.    History reviewed. No pertinent family history.      Listed Medications on Arrival:    .     Home Medications     Med List Status: In Progress Set By: Marikay Alar, RN at 02/08/2021  5:27 PM        No Medications         Allergies: He has No Known Allergies.            VISIT INFORMATION        Clinical Course in the ED:             Medications Given in the ED:    .     ED Medication Orders (From admission, onward)    None          Procedures:      Procedures      Interpretations:      EKG -             interpreted by me: normal sinus rhythm, rate 76, incomplete RBBB.            RESULTS        Lab Results:      Results     Procedure Component Value Units Date/Time    Rapid drug screen, urine [[id number]] Collected: 02/08/21 1848    Specimen: Urine Updated: 02/08/21 1933     Urine Amphetamine Screen  Negative     Barbiturate Screen, UR Negative     Benzodiazepine Screen, UR Negative     Cannabinoid Screen, UR Negative     Cocaine, UR Negative     Opiate Screen, UR Negative     PCP Screen, UR Negative    Narrative:      Rescheduled by [id number] at 02/08/2021 17:17 Reason: Patient unable to   provide specimen.    COVID-19 (SARS-COV-2) (ID Now)- Behavioral health admission (no isolation) [[id number]] Collected: 02/08/21 1713    Specimen: Nasopharyngeal Swab from Nasopharynx Updated: 02/08/21 1812     Purpose of COVID testing Screening     SARS-CoV-2 Specimen Source Nasopharyngeal     SARS CoV-2 Negative    Narrative:      o Collect and clearly label specimen type:  o Upper respiratory specimen: One Nasopharyngeal Dry Swab NO  Transport Media.  o Hand deliver to laboratory ASAP  Indication for testing->Behavioral health admission  Screening    TSH [[id number]] Collected: 02/08/21 1701    Specimen: Blood Updated: 02/08/21 1754     TSH 1.32 uIU/mL     Acetaminophen Level [[id number]]  (Abnormal) Collected: 02/08/21 1701    Specimen: Blood Updated: 02/08/21 1735     Acetaminophen Level <7 ug/mL     Ethanol (Alcohol) Level [[id number]] Collected: 02/08/21 1701    Specimen: Blood Updated: 02/08/21 1735     Alcohol None Detected mg/dL     Salicylate Level [[id number]]  (Abnormal) Collected: 02/08/21 1701    Specimen: Blood Updated: 02/08/21 1735     Salicylate Level <5.0 mg/dL     Lithium level [[id number]]  (Abnormal) Collected: 02/08/21 1701     Updated: 02/08/21 1735     Lithium Level <0.1 mEq/L     Comprehensive metabolic panel [[id number]]  (Abnormal) Collected: 02/08/21  1701    Specimen: Blood Updated: 02/08/21 1732     Glucose 101 mg/dL      BUN 16.1 mg/dL      Creatinine 0.9 mg/dL      Sodium 096 mEq/L      Potassium 4.0 mEq/L      Chloride 104 mEq/L      CO2 20 mEq/L      Calcium 10.2 mg/dL      Protein, Total 8.3 g/dL      Albumin 4.6 g/dL      AST (SGOT) 32 U/L      ALT 25 U/L      Alkaline Phosphatase 78 U/L      Bilirubin, Total 0.7  mg/dL      Globulin 3.7 g/dL      Albumin/Globulin Ratio 1.2     Anion Gap 14.0    GFR [[id number]] Collected: 02/08/21 1701     Updated: 02/08/21 1732     EGFR >60.0    CBC and differential [[id number]]  (Abnormal) Collected: 02/08/21 1701    Specimen: Blood Updated: 02/08/21 1721     WBC 9.28 x10 3/uL      Hgb 16.5 g/dL      Hematocrit 29.5 %      Platelets 293 x10 3/uL      RBC 5.42 x10 6/uL      MCV 88.7 fL      MCH 30.4 pg      MCHC 34.3 g/dL      RDW 13 %      MPV 10.8 fL      Neutrophils 61.7 %      Lymphocytes Automated 27.0 %      Monocytes 9.8 %      Eosinophils Automated 0.8 %      Basophils Automated 0.5 %      Immature Granulocytes 0.2 %      Nucleated RBC 0.0 /100 WBC      Neutrophils Absolute 5.72 x10 3/uL      Lymphocytes Absolute Automated 2.51 x10 3/uL      Monocytes Absolute Automated 0.91 x10 3/uL      Eosinophils Absolute Automated 0.07 x10 3/uL      Basophils Absolute Automated 0.05 x10 3/uL      Immature Granulocytes Absolute 0.02 x10 3/uL      Absolute NRBC 0.00 x10 3/uL             Radiology Results:      No orders to display             Scribe Attestation:      I was acting as a scribe for Shawntay Prest, Louanne Belton., MD on [place],Tyon Jaynie Collins    I am the first provider for this patient and I personally performed the services documented. Rolly Pancake is scribing for me on [place],Goro WILLIAM. This note and the patient instructions accurately reflect work and decisions made by me.  Evany Schecter, Louanne Belton., MD

## 2021-02-08 NOTE — ED Notes (Signed)
Patient declined vital signs on discharge. Stated, "I am just ready to go home." Discharge paperwork given.

## 2021-02-08 NOTE — Progress Notes (Signed)
Psychiatric Evaluation Part I    Walter Villarreal is a 25 y.o. male admitted to the [facility] Emergency Department who was seen via Telepsych on 02/08/2021 by Enos Fling, M.Ed. Resident in Counseling    Call Details  Patient Location: [facility] ED  Patient Room Number: N33  Time contacted by ED Physician: 1728  Time consult began: 2000  Time (in minutes) from Call to Consult: 152  Time consult concluded: 2110  Referring ED Department  Emergency Department: [facility]        [place] Suicide Severity Rating Scale (Short Version)  1. In the past month - Have you wished you were dead or wished you could go to sleep and not wake up?: No  2. In the past month - Have you actually had any thoughts of killing yourself?: No  6. Have you ever done anything, started to do anything, or prepared to do anything to end your life: No  CSSRS Risk Level : No risk    Specific Questioning About Thoughts, Plans, and Suicidal Intent  How many times have you had these thoughts? (Past Month): Does not apply  When you have the thoughts how long do they last? (Past Month): Does not apply  Could/can you stop thinking about killing yourself or wanting to die if you want to? (Past Month): Does not attempt to control thoughts  Are there things - anyone or anything (e.g. family, religion, pain of death) - that stopped you from wanting to die or acting on thoughts of suicide? (Past Month): Does not apply  What sort of reasons did you have for thinking about wanting to die or killing yourself?  Was it to end the pain or stop the way you were feeling (in other words you couldnt go on living with this pain or how you were feeling) or was it to get attention: Does not apply  Total Score of Intensity of Ideation: 0    Step 2: Identify Risk Factors  Clinical Status (Current/Recent): Agitation or severe anxiety  Clinical Status (Lifetime): Patient denies  Precipitants/Stressors: Triggering events leading to humiliation, shame, and/or despair (e.g. Loss  of relationship, financial or health status) (real or anticipated)  Treatment History / Dx: Previous psychiatric diagnoses and treatments  Access to lethal methods: No, does not have access to lethal methods    Step 3: Identify Protective Factors  Internal: Ability to cope with stress,Identifies reasons for living  External: Responsibility to family or others, living with family,Supportive social networks or family  Other protective factors: NA    Step 4: Guidelines to Determine Level of Risk  Suicide Risk Level: Low    Step 5: Possible Interventions to LOWER Risk Level  Behavioral Health Ambulatory, or Emergency Department: Mental heatlh referral at discharge  Behavioral Health Inpatient Unit:  (Pt is able to contract for safety, pt is being d/c with resources and IPAC referral.)    Step 6: Documentation  Summary of Evaluation: Pt reports of active bipolar manic symptoms. Pt reports of noncompliance of medication since 02/2018. Pt denies SI/HI/AVH/SIB. Pt is able to contract for safety. Pt is d/c with resources and IPAC referral.  --  Presenting Problem: Pt is a 25 y/o male BIB parent to ED d/t presenting active Bipolar 1 disorder w/manic symptoms. Pt A & O 4x.  Pt reports of psychiatric inpatient treatment hx in 02/2018, where he was diagnosed with Bipolar 1 disorder. Pt reports of being prescribed medication in 02/2018 to support psychiatric needs, but  discontinued d/t feeling less symptoms and progress. Pt reports of noncompliance of any medication since 02/2018 for psychiatric support.  Pt denies HI/AVH/SIB/SI.      Current Major Stressors: finances    Psychiatric History:      Inpatient treatment history: Pt denied.      Outpatient treatment history (current & past): Pt reports psychiatric treatment 02/2018 for Bipolar 1 disorder.     Diagnoses (past): Bipolar Disorder   Prior Medication trials:    Trazodone   Zxpera   Lithobid     Suicide Attempts/self- Injurious behaviors: Pt denied.     Hx of  violence/aggression:   Within the Last 6 Months:: no history of violence toward self  Greater than 6 Months Ago:: no history of violence toward self      Violence Toward Others  Within the Last 6 Months:: no history of violence toward others  Greater than 6 Months Ago:: no history of violence toward others     Trauma History: Pt denied.    Medical History (Include active  and chronic medical conditions): see medical hx.                Substance Abuse History  Previous Substance Abuse Treatment?: No  Recovery and Support Involvement  Current Involvement: None  Past Involvement: None  Sponsor (Yes/No): NA  Have you been prescribed medications to support recovery?: None  Recovery Resources/Support: NA  Legal Guardian/Parent: NA  Support Systems: Parent,Spouse/significant other  Substance Recovery Support  Have you been prescribed medications to support recovery?: None  Recovery Resources/Support: NA  Transportation Available: NA  Past Withdrawal Symptoms  Past Withdrawal Symptoms: None  History of Blackouts?: No  History of Withdrawal Seizures?: No      Preliminary Diagnosis #2: NA  Preliminary Diagnosis #3: NA  Preliminary Diagnosis #4: NA    Preliminary Diagnosis #5: NA      Home Medications (names, doses, frequency, psychiatric, non psychiatric, compliant/non compliant): Pt reports noncompliance of psychiatric medication since 02/2018.    Social History:        Living Arrangements: Parent               Type of Residence: Private residence                                                          Support Systems: Parent,Spouse/significant other         Employment status/occupation: currently, employed.      Legal history: Pt denied.  Family History (psych dx, substance use, suicide attempts): Pt denied.    Presenting Mental Status  Orientation Level: Oriented X4  Memory: No Impairment  Thought Content: normal  Thought Process: disorganized  Behavior: tearful  Consciousness: Alert  Impulse Control: normal  Perception:  normal  Eye Contact: normal  Attitude: cooperative  Mood: anxious  Hopelessness Affects Goals: Yes  Hopelessness About Future: Yes  Affect: normal  Speech: normal  Concentration: normal  Insight: fair  Judgment: fair  Appearance: normal  Appetite: normal  Weight change?: normal  Energy: normal  Sleep: difficulty falling asleep  Reliability of Reporter/Patient: fair    Summary: Pt is a 25 y/o male BIB parent to ED d/t presenting active Bipolar 1 disorder w/manic symptoms. Pt reported arriving to ED seeking lithium as it has helped with his  Bipolar 1 disorder symptoms when manic. Pt agreed for parent presence during psych evaluation. During evaluation, pt reported of psychiatric inpatient treatment hx in 02/2018, where he was diagnosed with Bipolar 1 disorder at [facility] for demonstrating manic behaviors while in college. Pt reported being prescribed psychiatric medication in 02/2018, but later discontinued d/t feeling " better" and less symptoms. While assessing, pt presented anxious with rapid speech. Pt reported first manic episode occurred on 02/05/21 night while talking to his father regarding metaverse; which spiraled in the morning of 02/06/21 while heading to work. Pt reported the inability to report to work and instead met with his mother for coffee. Pt reported the conversation that was held on 02/05/21 triggered his manic symptoms and became uncontrollable. Pt reported of rapid speech, irritability, lack of sleep and a overall intense feeling. Pt denied psychiatrist or therapist supports. In evaluation, pt reported the need to psychiatric support for mental stabilization. Pt denies HI/AVH/SIB/SI.        Diagnosis: Preliminary Diagnosis #1: F39 Unspecified Mood Disorder    Preliminary Diagnosis (DSM IV)  Axis I: F 39 Unspecified Mood Disorder  Axis II: deferred  Axis III: see medical report.  Axis IV: Access to health care services,Occupational  Axis V on Admission: 70-61  Axis V - Highest in Past Year:  UTA    Patient expects to be discharged to:: residence with [facility] referral and resources.  Disposition  Disposition Outcome - Choose One: All Other  Other Disposition Sources: IPAC (Pt is able to contract for safety. Pt denied SI/SIB/HI/AVH. Pt will be d/c with resources and IPAC referral.)    Justification for disposition: Pt expressed feeling better after "talking to someone" and content with receiving resources to support his psychiatric needs. Pt reports eating with mother and trying to get some rest, if d/c from ED. Pt does not pose an danger to self or others and does not meet criteria for inpatient psych treatment. Pt will be d/c will IPAC referral and resources.    Patient's COVID Status/Vaccine Info:  1) Is the patient currently COVID+ or COVID-?COVID-  2) If COVID+, is the patient symptomatic or asymptomatic? NA  3) Is the patient fully vaccinated (both doses + booster)? 1 dose  4) If no, how many doses have they had?NA  If patient is voluntarily admitted to an inpatient psychiatric unit and decides to leave AMA within the first 8 hours on the unit, is there an identified petitioner? NA    Name of Petitioner: NA  Contact Information:NA  If no petitioner is identified, please explain why not: [employer], [employer] information:NA    Was consent for voluntary admission obtain and scanned into EPIC? NA   By whom? NA      Enos Fling, M.Ed, Resident in [facility]  [address]  [place], [place] [address]  [phone]

## 2021-02-09 ENCOUNTER — Encounter (HOSPITAL_BASED_OUTPATIENT_CLINIC_OR_DEPARTMENT_OTHER): Payer: Self-pay

## 2021-02-09 ENCOUNTER — Telehealth (HOSPITAL_BASED_OUTPATIENT_CLINIC_OR_DEPARTMENT_OTHER)
Payer: No Typology Code available for payment source | Admitting: Psychiatric - Mental Health Nurse Practitioner (Across the Lifespan)

## 2021-02-09 NOTE — Progress Notes (Signed)
3:03p  PCCM received phone call from Pt's mother reporting that Pt left the home. Per Pt's mother, the Pt left on foot and cannot be found. Pt's mother reports Pt left his phone and keys. Pt's mother was very tearful.      PCCM updated NP Brigham regarding Pt being a potential no-show.        3:41p  Pt's mother contacted IPAC and spoke with PCCM.  Pt's mother and father found Pt and brought Pt home safely. PCCM assisted Pt and Pt's mother in rescheduling IPAC apt for tomorrow 02/10/2021.

## 2021-02-09 NOTE — Progress Notes (Signed)
IPAC Prescreen Additional Information:      This Clinical research associate contacted Pt regarding IPAC scheduling.       "You are seeking a psychiatric evaluation for medication management is that correct?" yes  - Pt provided PAA permission to speak with mother Lissa Hoard for scheduling.  (If Pt reports "No" discontinue scheduling. Pt does not meet criteria.)          What type of appointment would you prefer? (Please bold what the patient prefers; for options 2 and 3, please inform patient they will need to wear a mask the entire time they are at [facility])  1) Virtual appointment  2) In clinic virtual appointment  3) In clinic face-to-face appointment     If patient prefers options 2 or 3, then ask the following questions:  1) Are you currently experiencing fever, cough or shortness of breath?  n/a  2) Are you currently experiencing chills, sore throat, new headache, loss of taste or smell, or body aches not attributable to physical activity?  n/a  3) Have you had close contact with a COVID-19 patient?  n/a  4) Have you recently been tested for COVID-19?  n/a  5) Have you ever been diagnosed with COVID-19?  n/a  6) Do you live in a group living residence, such as an assisted living facility, nursing home, shelter or dormitory?  n/a    If you plan to come in person, do you need a support person (friend or family member to attend the visit)? n/a      PAA reminded Pt that IPAC does not provide established psychiatric care and prescribed controlled medications.   Pt reports clear and verbal understanding.

## 2021-02-10 ENCOUNTER — Encounter (HOSPITAL_BASED_OUTPATIENT_CLINIC_OR_DEPARTMENT_OTHER): Payer: Self-pay

## 2021-02-10 ENCOUNTER — Telehealth (INDEPENDENT_AMBULATORY_CARE_PROVIDER_SITE_OTHER): Payer: No Typology Code available for payment source | Admitting: Psychiatry

## 2021-02-10 ENCOUNTER — Encounter (HOSPITAL_BASED_OUTPATIENT_CLINIC_OR_DEPARTMENT_OTHER): Payer: Self-pay | Admitting: Psychiatry

## 2021-02-10 DIAGNOSIS — F3162 Bipolar disorder, current episode mixed, moderate: Secondary | ICD-10-CM

## 2021-02-10 DIAGNOSIS — F4321 Adjustment disorder with depressed mood: Secondary | ICD-10-CM

## 2021-02-10 MED ORDER — QUETIAPINE FUMARATE 50 MG PO TABS
25.0000 mg | ORAL_TABLET | Freq: Every evening | ORAL | 2 refills | Status: DC | PRN
Start: 2021-02-10 — End: 2022-09-08

## 2021-02-10 MED ORDER — LITHIUM CARBONATE ER 300 MG PO TBCR
300.0000 mg | EXTENDED_RELEASE_TABLET | Freq: Two times a day (BID) | ORAL | 2 refills | Status: DC
Start: 2021-02-10 — End: 2022-08-20

## 2021-02-10 MED ORDER — QUETIAPINE FUMARATE 50 MG PO TABS
25.0000 mg | ORAL_TABLET | Freq: Every evening | ORAL | 2 refills | Status: DC | PRN
Start: 2021-02-10 — End: 2021-02-10

## 2021-02-10 NOTE — Progress Notes (Signed)
Writer called pt, first had pt verify their full name and DOB, then reviewed medications and vitamins. No known allergies. Preferred pharmacy is noted.Pt has completed e-check in, and knows to be in video visit 15 minutes prior to start time.

## 2021-02-10 NOTE — Progress Notes (Signed)
[facility] Psychiatric Evaluation    Date/Time:   02/10/2021  8:29 AM  Name:  Walter Villarreal, Walter Villarreal  MRN:    [id number]  Age:   25 y.o.  DOB:   26-Aug-1996  Sex:  male    CHIEF COMPLAINT  "Management of Bipolar mood symptoms, post ED visit".  "I just need lithium to stabilize my mood".    HISTORY OF PRESENT ILLNESS  This visit is being conducted via My Chart Vidyo to minimize exposure to COVID-19.  Patient was identified by name and DOB.  Patient was at his home in [place], [place]. Provider location at home office.  Patient gave verbal consent to the prescribed medications.  Patient's mother participated a few times to contribute with her observation with the agreement of patient.    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  He was treated with lithium, Zyprexa, trazodone.  His mom reports that he was having extreme paranoia but denies AV hallucinations.  Reports that he was threatened with ECT treatment multiple times if he does not comply with group therapies and medications prescribed during the hospital stay.  He has been off of medication since 2019 as he was feeling back to normal self.  Reports that he was in college in 2019 when he became manic, stressed by his school teacher who was mean to him he also went through difficult relationship which ended breaking up.  He usually struggle a lot in classes but during the whole semester of mania he was 'doing fantastic'.    Current episode started after he recently returned back from [place] visiting his girlfriend.  He was very sure that 'she is the one who he wanted to marry', thinking that "he will never let someone to take her away".  He decided to support with what ever she wanted and wanted to protect her.  His girlfriend has  been texting him pictures of the dogs that she loves.  He has been working very hard to buy the expensive dog that she wanted.  He has been working 3 jobs, sleeping less and less.  He went through sleep studies but did not see the report.    He [facility] on [facility]/09/2020 with manic symptoms and eventually referred to [facility].  The following is a copy of psych liaison note--    "While assessing, pt presented anxious with rapid speech. Pt reported first manic episode occurred on 02/05/21 night while talking to his father regarding metaverse; which spiraled in the morning of 02/06/21 while heading to work. Pt reported the inability to report to work and instead met with his mother for coffee. Pt reported the conversation that was held on 02/05/21 triggered his manic symptoms and became uncontrollable. Pt reported of rapid speech, irritability, lack of sleep and a overall intense feeling. Pt denied psychiatrist or therapist supports. In evaluation, pt reported the need to psychiatric support for mental stabilization. Pt denies HI/AVH/SIB/SI."    Reports that he became very depressed since age 51 when he lost his 5 years older sister who committed suicide while in [place].  Patient was very emotional, labile, tearful talking about his sister but no pressured speech, no flight of ideas at this time.  Reports that he also lost a parental figure  woman to esophageal cancer while he was in boarding high school.  He has been very depressed from these losses and has not been treated for grief.  His mom reports that he is very lucid today but he left the house barefoot yesterday without taking cell phone or keys but took a carjack.  He had to reschedule appointment at [facility] yesterday.  He did not know where he was and his parents was able to find him a few hours later.  He reports feeling both depression and mania but denies any grandiosity.  He appears impulsive with labile mood. Reports mixed mood symptoms depressive and  manic states. His mother reports, racing with time, impulsive, psychomotor agitation at times, doing chores very quickly, eating very quickly in the past few days and wandering behavior yesterday with possible some paranoia.    His mom is monitoring him 24/7 and has no imminent concern for his safety.  Denies active or passive suicidal ideations.  Denies any significant drugs or alcohol abuse.        Narrative & Impression    NORMAL SINUS RHYTHM  INCOMPLETE RIGHT BUNDLE BRANCH BLOCK  BORDERLINE NORMAL/ABNORMAL ELECTROCARDIOGRAM  WHEN COMPARED WITH ECG OF  08-Feb-2021 17:01,  (UNCONFIRMED)  NO SIGNIFICANT CHANGE WAS FOUND       PSYCHIATRIC REVIEW OF SYMPTOMS  Subjective Mood: "Sublime, both up and down in the past few days"   Sleep: Onset difficulty, Maintenance difficulty, 3 hours   Appetite/Weight: Decreased / Known loss of 40 lbs over 6 to 12 months-intentional   Focus & Concentration: superfocused   Energy Level: Variable   Delusions:  Paranoid: people judging me, "may be I am insecure". "overwhelming sense of  privillege" while in school.   Hallucinations: Denies    Suicide or Self-Injury?: Yes, Hx of passive SI. Denies current   Homicide or Violence?: No   Access to Guns?: No     Past Psychiatric History  Current Provider(s) PCP   Diagnoses Bipolar Disorder   Previous Medications Lithium, Zyprexa, Trazodone   Hospitalizations Yes: once 2019   Suicide Attempts No    Self Injury No   Violence to Others No   Head Injury Yes: concussion LOC(+) sports related   Seizures No   Suicide Exposure Yes: sister        Social History  Lives With Parent(s)   Marital/Children single / never married / 0 Child(ren)   Employment  Environmental education officer, Forensic scientist Issues No   Education Bachelor's Degree       Substance Abuse History  Drugs ' I do not know'.  UDS was negative on 02/08/2021   Tobacco No, Vaping in college. Denies current   Alcohol Not daily, with friends or family, prefers wine, 'I usually stopped before my limits'.        No family history on file.      MEDICAL HISTORY    Current/Home Medications    No medications on file       Past Medical History:   Diagnosis Date    Bipolar 1 disorder        Past Surgical History:   Procedure Laterality Date    TONSILLECTOMY, ADENOIDECTOMY         No Known Allergies       PSYCHIATRIC SPECIALITY & MENTAL STATUS EXAM  Vital Signs There were no vitals taken for this visit.   General Appearance Neatly groomed, appropriately dressed and adequately nourished   Muskuloskeletal No weakness, abnormal movements, or  other impairments in head and neck regions   Gait/Station  Unable to assess   Speech Normal Rate, Rythym, & Volume, not pressured   Thought Process Logical, Linear, Goal Directed, no flight of ideas   Associations Intact    Thought Content No evidence of homicidal, suicidal, violent, or delusional thought content   Perceptions No evidence of hallucinosis   Judgment No Impairment   Insight  Fair   LOC/Orientation A&O x 4, Sensorium Clear   Memory improving   Attention & Concentration Normal   Fund of Knowledge Adequate given patient age, socioeconomic status, and educational level   Language Fluent with no impairments in comprehension or expression   Mood Depressed   Affect Limited / Constricted, Tearful, Labile     CSSRS Questions:          C-SSRS Suicidal Ideation Severity    Ask questions that are in italics for the past month. yes no   1)    Wish to be dead  []    [x]        In the past month, Have you wished you were dead or wished you could go to sleep and not wake up?     2)   Current suicidal thoughts  []    [x]        In the past month, Have you actually had any thoughts of killing yourself?     IF YES TO 2, ASK 3, 4,5. IF NO TO 2, GO TO QUESTION 6.         3)   Suicidal thoughts w/ Method (w/no specific Plan or Intent or act)  []   []        In the past month, Have you been thinking about how you might do this?        (I.e. thoughts to overdose; etc.)          4)    Suicidal Intent  without Specific Plan  []    []        In the past month, Have you had these thoughts and had some intention of acting on them?    (I.e. having thoughts to overdose with some intent to act on these thoughts)           5)    Intent with Plan  []    []        In the past month, Have you started to work out or worked out the details of how to kill yourself? Do you intend to carry out this plan?    (I.e. plan to overdose on Tuesday with intent to do so on Tuesday vs. General thought for Tuesday)        6) C-SSRS Suicidal Behavior: "Have you ever done anything, started to do anything, or prepared to do anything to end your life? Lifetime Lifetime      []    [x]       3-2 months ago  1 month ago     If YES Was it within the past 3 months?  []   []    []     [ IF ORANGE OR RED, COMPLETE IPAC STEP 3 IN THIS SPACE. ]       ASSESSMENT  25 y.o. male with history of concussion with history of loss of consciousness over 4 years ago, abnormal EKG, positive for family history of mood disorder, depression and suicide, reports past history of depression since age 43 when he lost his older sister who committed suicide followed by loss of  a parental figure woman while in boarding high school.  He is still grieving for these losses.  Reports history of one psychiatric hospitalization in March 2019 at a hospital in [place] when he was very paranoid with manic symptoms, diagnosed with bipolar type I for the first time in life.  Denies history of SAs or significant substance abuse. He presented with prolonged bereavement from the loss of his sister at his age 4 years old, mixed mood symptoms depressive and manic when he became impulsive, psychomotor agitation at times, doing chores very quickly, eating very quickly wandering behavior in the past few days with possible some paranoia but denies any AV hallucinations, delusions or grandiosity at this time.  There is no imminent safety concerns at this time as patient is not  suicidal/homicidal/psychotic.    Reviewed risks/benefits of treatment options as well as potential side effects of medications and medication interactions.   Patient agrees with safety plan which includes calling [facility] call center or 911 or go to ER if needed and to follow up with treatment recommendations.  All questions answered and concerns addressed.       ENCOUNTER DIAGNOSIS     1. Complicated bereavement     2. Bipolar disorder, current episode mixed, moderate           PLAN  Treatment options and alternatives reviewed with patient, along with detailed discussion of medication(s) and side effects, and they concur with following plan:    Medications:   Restart lithium CR 300 mg twice daily for 5 days then increase to 300 mg every morning and 600 mg nightly   Start Seroquel 50 mg tablet, 25 mg at bedtime as needed for sleep/mood/anger/agitation.  May increase to 25 mg every night up to 100 mg at bedtime.  May use 25-50 mg in daytime as needed if tolerated.    Therapies:   Psychotherapy: Patent to arrange thru their Insurance Panel, [facility], or other external referral   PHP: Pt educated on PHP, declined to particpate    Labs/Other:  1. Please do lithium level, 1 week after taking lithium 3 tablets daily.  Please give the blood sample in the morning before taking the morning dose, 12 hours after the last dose of lithium.  Patient and his mother prefer to get blood work done by his PCP NP.  2. Please fax the blood results to [phone] to [facility] front desk, at the time of next visit to get refills on the lithium  3. Recent TSH, CBC and CMP done on 02/08/2021 are within normal.  EKG shows incomplete right RBBB.    DISPOSITION & FOLLOW-UP   Discharge to: Self-care and in care of his parents   Follow-up: Patient will schedule with an in network psychiatrist.   Return to [facility]     _____________________________________________  Janetta Hora, MD

## 2021-02-10 NOTE — Patient Instructions (Signed)
MEDICATION INSTRUCTIONS                    1. Take all medications as prescribed; do not stop medications or change dosages without talking to your provider(s).  2. Abstain from alcohol and/or illegal drugs as they interfere with psychiatric medications.   3. Immediately go to the nearest emergency department or call 911 if you have any thoughts of wanting to harm yourself or others, or for any other crisis.  4. Consult with your pharmacist if you questions about your medications, their side effects or possible interactions with other medications you take.      FOLLOW-UP CARE APPOINTMENTS    1. PSYCHIATRIC MEDICATION MANAGEMENT: Restart Lithium CR 300 mg twice daily for 5 days then increase to 300 mg every morning and 600 mg nightly.  Use Seroquel 25-50 mg as needed for mood/anxiety/sleep.  2. PSYCHOTHERAPY: If you do not already have a therapist, find one by contact your insurance provider for a list of in-network providers. Time-limited, psychotherapy programs are available through [facility] by calling [phone]. Another source for therapists is [email or web address]-[email or web address]  3. Return to [facility] in 2 to 3 weeks, or anytime if necessary (but not for routine matters like refills). [facility] hours are 8a,-4pm Monday thru Friday, excluding Major Holidays. [facility] is currently seeing patients by appointment only.  4. Please do lithium level, 1 week after taking lithium 3 tablets daily.  Please give the blood sample in the morning before taking the morning dose, 12 hours after the last dose of lithium.  5. Please fax the blood results to [phone] to [facility] front desk, at the time of next visit to get refills on the lithium      ABOUT YOUR MEDICATIONS:        Seroquel Oral Tablet 50 mg  Uses  This medicine is used for the following purposes:   anxiety   bipolar disorder   depression   schizophrenia  Instructions  This medicine may be taken with or without food.  It is very important that you take the medicine at about  the same time every day. It will work best if you do this.  Keep the medicine at room temperature. Avoid heat and direct light.  It may take several weeks for this medicine to fully work.  It is important that you keep taking each dose of this medicine on time even if you are feeling well.  If you forget to take a dose on time, take it as soon as you remember. If it is almost time for the next dose, do not take the missed dose. Return to your normal dosing schedule. Do not take 2 doses of this medicine at one time.  Please tell your doctor and pharmacist about all the medicines you take. Include both prescription and over-the-counter medicines. Also tell them about any vitamins, herbal medicines, or anything else you take for your health.  Do not suddenly stop taking this medicine. Check with your doctor before stopping.  This medicine may cause higher blood sugar levels or diabetes. Please follow your doctor's instructions and check your blood sugar level regularly while on this medicine.  Cautions  Tell your doctor and pharmacist if you ever had an allergic reaction to a medicine. Symptoms of an allergic reaction can include trouble breathing, skin rash, itching, swelling, or severe dizziness.  Do not use the medication any more than instructed.  This medicine may cause dizziness or fainting, especially after exercising  or in hot weather. Be very careful when standing or sitting up quickly.  Your ability to stay alert or to react quickly may be impaired by this medicine. Do not operate machinery or drive while on this medicine.  Do not drink beverages with alcohol while on this medicine.  Avoid becoming overheated during exercise or other activities. Try to stay cool in hot weather.  Contact your doctor if you notice a change in the amount or darkening of your urine.  Family should check on the patient often. Call the doctor if patient becomes more depressed, has thoughts of suicide, or shows changes in  behavior.  Tell the doctor or pharmacist if you are pregnant, planning to be pregnant, or breastfeeding.  Ask your pharmacist if this medicine can interact with any of your other medicines. Be sure to tell them about all the medicines you take.  Do not start or stop any other medicines without first speaking to your doctor or pharmacist.  Do not share this medicine with anyone who has not been prescribed this medicine.  This medicine can cause serious side effects in some patients. Important information from the U.S. Food and Drug Administration (FDA) is available from your pharmacist. Please review it carefully with your pharmacist to understand the risks associated with this medicine.  Side Effects  The following is a list of some common side effects from this medicine. Please speak with your doctor about what you should do if you experience these or other side effects.   agitated feeling or trouble sleeping   constipation   dizziness   drowsiness or sedation   dry mouth   headaches   rapid heartbeat   low blood pressure   liver problems   muscle pain   skin irritation such as redness, itching, rash, or burning   weight gain  Call your doctor or get medical help right away if you notice any of these more serious side effects:   breathing interruption during sleep   fever   swelling in the neck or throat   difficulty or discomfort urinating  A few people may have an allergic reaction to this medicine. Symptoms can include difficulty breathing, skin rash, itching, swelling, or severe dizziness. If you notice any of these symptoms, seek medical help quickly.  Extra  Please speak with your doctor, nurse, or pharmacist if you have any questions about this medicine.  [email or web address]  IMPORTANT NOTE: This document tells you briefly how to take your medicine, but it does not tell you all there is to know about it.Your doctor or pharmacist may give you other documents about  your medicine. Please talk to them if you have any questions.Always follow their advice. There is a more complete description of this medicine available in [place].Scan this code on your smartphone or tablet or use the web address below. You can also ask your pharmacist for a printout. If you have any questions, please ask your pharmacist.      2021 [employer]          Lithium Carbonate Extended Release Oral Tablet 300 mg  Uses  This medicine is used for the following purposes:   bipolar disorder   prevent migraine headaches  Instructions  Swallow the medicine without crushing or chewing it.  Take the medicine with 250 mL (1 cup) of water.  You may take with food to prevent stomach upset.  It is very important that you take the medicine at about the same  time every day. It will work best if you do this.  Store at room temperature in a dry place. Do not keep in the bathroom.  Keep the medicine away from heat and light.  Drink extra water while on this medicine. Adults should try to drink 6-8 cups (48 to 64 oz.) of water every day.  It may take several weeks for this medicine to fully work.  It is important that you keep taking each dose of this medicine on time even if you are feeling well.  If you forget to take a dose on time, take it as soon as you remember. If it is almost time for the next dose, do not take the missed dose. Return to your normal dosing schedule. Do not take 2 doses of this medicine at one time.  Please tell your doctor and pharmacist about all the medicines you take. Include both prescription and over-the-counter medicines. Also tell them about any vitamins, herbal medicines, or anything else you take for your health.  If your symptoms do not improve or they worsen while on this medicine, contact your doctor.  The foods you eat can change how well this medicine works. Tell your doctor about the types of food you normally eat. Ask your doctor for a list of foods that can affect this  medicine. Do not significantly change the foods you normally eat while on this medicine.  Cautions  Tell your doctor and pharmacist if you ever had an allergic reaction to a medicine. Symptoms of an allergic reaction can include trouble breathing, skin rash, itching, swelling, or severe dizziness.  Do not use the medication any more than instructed.  Your ability to stay alert or to react quickly may be impaired by this medicine. Do not drive or operate machinery until you know how this medicine will affect you.  Please check with your doctor before drinking alcohol while on this medicine.  If you drink more than a few alcoholic beverages each day, ask your doctor whether you should be on this medicine.  Avoid becoming overheated during exercise or other activities. Try to stay cool in hot weather.  Contact your doctor if you notice a change in the amount or darkening of your urine.  Family should check on the patient often. Call the doctor if patient becomes more depressed, has thoughts of suicide, or shows changes in behavior.  Call the doctor if there are any signs of confusion or unusual changes in behavior.  Tell the doctor or pharmacist if you are pregnant, planning to be pregnant, or breastfeeding.  Ask your pharmacist if this medicine can interact with any of your other medicines. Be sure to tell them about all the medicines you take.  Do not start or stop any other medicines without first speaking to your doctor or pharmacist.  Do not share this medicine with anyone who has not been prescribed this medicine.  This medicine can cause serious side effects in some patients. Important information from the U.S. Food and Drug Administration (FDA) is available from your pharmacist. Please review it carefully with your pharmacist to understand the risks associated with this medicine.  Side Effects  The following is a list of some common side effects from this medicine. Please speak with your doctor about what you  should do if you experience these or other side effects.   dizziness   drowsiness or sedation   lack of energy and tiredness   thirst   increased urinary frequency  weight gain  Call your doctor or get medical help right away if you notice any of these more serious side effects:   loss of balance   diarrhea   fainting   fever   cold hands or feet   fast or irregular heart beats   pain in the joints   muscle weakness   unusual growth or lump on the neck   seizures   shakiness   shortness of breath   slurred speech   unsteadiness while walking   blurring or changes of vision   vomiting  A few people may have an allergic reaction to this medicine. Symptoms can include difficulty breathing, skin rash, itching, swelling, or severe dizziness. If you notice any of these symptoms, seek medical help quickly.  Extra  Please speak with your doctor, nurse, or pharmacist if you have any questions about this medicine.  [email or web address]  IMPORTANT NOTE: This document tells you briefly how to take your medicine, but it does not tell you all there is to know about it.Your doctor or pharmacist may give you other documents about your medicine. Please talk to them if you have any questions.Always follow their advice. There is a more complete description of this medicine available in [place].Scan this code on your smartphone or tablet or use the web address below. You can also ask your pharmacist for a printout. If you have any questions, please ask your pharmacist.      2021 [employer]              [place] MENTAL HE[facility] RESOURCES    [facility] Call Center  - [phone] 24/7/365  For admissions and screening for all [facility], including:   Comprehensive Addiction Treatment Services (CATS) Inpatient Detox, IOP Intensive Outpatient Programs   Partial Hospitalization Program (PHP), Outpatient psychiatry, Outpatient counseling        [facility]  [facility] Psychiatric  Assessment Center  [address]  [place], [place] [address] For Urgent Adult (18 and Over) Psychiatric Assessments [phone]     [facility]  [address]  [place], [place] [address]   For Child and Youth (Under 18) Mental Health and Substance Abuse Outpatient Services [phone]   [facility] [place]  [address]  [place], [place] [address] For Non-Urgent Psychiatric Appointments: [phone]   [facility]-   [facility]  [address]  [place], [place] [address]   For Non-Urgent Psychiatric Appointments: [phone]   [facility] [place]  [address] [place], [place] [address]   For Non-Urgent Psychiatric Appointments: [phone]   [facility]- [address]  [address]  [place], [place] [address]   For Non-Urgent Psychiatric Appointments: [phone]   [facility] [place]  [address]   [place], [place] [address]   For Non-Urgent Psychiatric Appointments: [phone]         COMMUNITY RESOURCES (MENTAL HEALTH CENTERS):      [facility]  Entry and Referral Services [phone]     [facility], [place], [place] [phone]   [facility], [place], [place] [phone]     [place], [place], [place] [phone]     [facility], [place], [place] [phone]     [facility] [place], [place], [place]  [phone]       [facility], [place], [place] [phone]     [facility], [place], [place]  [phone]       [facility] [address]  [place]) [phone]               Faythe Dingwall - [phone]

## 2022-06-18 ENCOUNTER — Telehealth: Payer: Self-pay

## 2022-08-03 ENCOUNTER — Encounter: Payer: Commercial Managed Care - POS | Admitting: Family Medicine

## 2022-08-03 NOTE — Progress Notes (Signed)
[place] - Consult   Non billable    Date Time: 08/03/2022 8:17 AM  Patient Name: Walter Villarreal, Walter Villarreal    Subjective:   Patient is a 26 y.o. male with PMH as below here for [place] [facility] consultation     [place] [facility] program discussed in detail and all questions answered    Bipolar 1  -Psych rec (quality doc)  -Dr. 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

## 2022-08-06 ENCOUNTER — Telehealth: Payer: Self-pay

## 2022-08-06 NOTE — Telephone Encounter (Signed)
From: Nicola Girt   Sent: Friday, August 06, 2022 10:32 AM  To: '[email or web address]  Subject: [place] [facility] - Annual Exam Appointment    Good morning [name],    Your [facility] [facility] laboratory and health exam appointments have been scheduled for you on the date and times listed below. Both visits will take place in our [facility] [place] office located at: [address], [place], [place] [address]. [employer].      Fasting Lab Appointment:  Tuesday, August 22,2023 at 9:30 am  (please plan 30 minutes for this appointment)    To Prepare for Your Appointment:  Do not eat or drink anything containing calories after midnight on the night prior to your appointment.  You may have water, black coffee, and your morning medications up to an hour before your appointment.    [facility] Exam:  Wednesday, September 20,2023 at 8:30 am  (please plan 2 hours for this appointment). More information on exam components.       Please bring a government-issued picture ID and your insurance card with you. To ensure a smooth check-in process and to minimize the handling of forms, please utilize MyChart to access your account and complete the e-check in process with registration forms in advance of your visit. We encourage you to contact our offices with any questions surrounding these forms and any items not completed can/will be obtained on site at the time of your visit. Access to MyChart FAQ's and assistance.     Please review your 360 Wellness Options which are covered annually through your membership as part of your annual exam. We encourage you to take advantage of the benefit of your choice! Further information on registration and scheduling can be obtained via our offices and your clinical team.      Important special instructions for the day of your appointment:  When entering the parking garage you will receive a ticket, please bring this ticket with you  to your visit. Our office can validate your parking and the ticket will be used again to exit the lot at no cost to you.      Courtesy note: Should you need to reschedule, please provide as much notice as possible by calling [phone] at least 3 business days before the exam.     We at 360 look forward to seeing you!    Nicola Girt  Patient Access Associate 3   [id number] [place], [place] [address]    O [phone]  [email or web address]        This communication may contain confidential and/or privileged information. Additionally, this communication may contain protected health information (PHI) that is legally protected from inappropriate disclosure by the Privacy Standards of the [employer] Portability and Accountability Act (HIPAA) and relevant [employer]. If you are not the intended recipient, please note that any dissemination, distribution or copying of this communication is strictly prohibited. If you have received this message in error, you should notify the sender immediately by telephone or by return e-mail and delete this message from your computer. Direct questions to the Personal assistant at [phone].

## 2022-08-09 ENCOUNTER — Other Ambulatory Visit: Payer: Self-pay | Admitting: Family Medicine

## 2022-08-09 DIAGNOSIS — Z0000 Encounter for general adult medical examination without abnormal findings: Secondary | ICD-10-CM

## 2022-08-09 DIAGNOSIS — Z136 Encounter for screening for cardiovascular disorders: Secondary | ICD-10-CM

## 2022-08-09 DIAGNOSIS — E538 Deficiency of other specified B group vitamins: Secondary | ICD-10-CM

## 2022-08-09 DIAGNOSIS — Z13228 Encounter for screening for other metabolic disorders: Secondary | ICD-10-CM

## 2022-08-09 DIAGNOSIS — E559 Vitamin D deficiency, unspecified: Secondary | ICD-10-CM

## 2022-08-09 DIAGNOSIS — Z0189 Encounter for other specified special examinations: Secondary | ICD-10-CM

## 2022-08-09 NOTE — Progress Notes (Signed)
Labs

## 2022-08-10 ENCOUNTER — Other Ambulatory Visit: Payer: Commercial Managed Care - POS

## 2022-08-12 ENCOUNTER — Ambulatory Visit (FREE_STANDING_LABORATORY_FACILITY): Payer: Commercial Managed Care - POS

## 2022-08-12 ENCOUNTER — Encounter: Payer: Self-pay | Admitting: Family Medicine

## 2022-08-12 DIAGNOSIS — Z136 Encounter for screening for cardiovascular disorders: Secondary | ICD-10-CM

## 2022-08-12 DIAGNOSIS — Z0000 Encounter for general adult medical examination without abnormal findings: Secondary | ICD-10-CM

## 2022-08-12 DIAGNOSIS — Z0189 Encounter for other specified special examinations: Secondary | ICD-10-CM

## 2022-08-12 DIAGNOSIS — E559 Vitamin D deficiency, unspecified: Secondary | ICD-10-CM

## 2022-08-12 DIAGNOSIS — E538 Deficiency of other specified B group vitamins: Secondary | ICD-10-CM

## 2022-08-12 DIAGNOSIS — F314 Bipolar disorder, current episode depressed, severe, without psychotic features: Secondary | ICD-10-CM

## 2022-08-12 DIAGNOSIS — Z13228 Encounter for screening for other metabolic disorders: Secondary | ICD-10-CM

## 2022-08-12 LAB — URINALYSIS WITH MICROSCOPIC
Bilirubin, UA: NEGATIVE
Blood, UA: NEGATIVE
Glucose, UA: NEGATIVE
Ketones UA: NEGATIVE
Leukocyte Esterase, UA: NEGATIVE
Nitrite, UA: NEGATIVE
Specific Gravity UA: 1.021 (ref 1.001–1.035)
Urine pH: 5.5 (ref 5.0–8.0)
Urobilinogen, UA: NORMAL mg/dL

## 2022-08-12 LAB — CBC
Absolute NRBC: 0 10*3/uL (ref 0.00–0.00)
Hematocrit: 48.6 % (ref 37.6–49.6)
Hgb: 16.2 g/dL (ref 12.5–17.1)
MCH: 30.1 pg (ref 25.1–33.5)
MCHC: 33.3 g/dL (ref 31.5–35.8)
MCV: 90.2 fL (ref 78.0–96.0)
MPV: 10.5 fL (ref 8.9–12.5)
Nucleated RBC: 0 /100 WBC (ref 0.0–0.0)
Platelets: 287 10*3/uL (ref 142–346)
RBC: 5.39 10*6/uL (ref 4.20–5.90)
RDW: 14 % (ref 11–15)
WBC: 6.8 10*3/uL (ref 3.10–9.50)

## 2022-08-12 LAB — COMPREHENSIVE METABOLIC PANEL
ALT: 28 U/L (ref 0–55)
AST (SGOT): 30 U/L (ref 5–41)
Albumin/Globulin Ratio: 1.2 (ref 0.9–2.2)
Albumin: 4.3 g/dL (ref 3.5–5.0)
Alkaline Phosphatase: 61 U/L (ref 37–117)
Anion Gap: 9 (ref 5.0–15.0)
BUN: 11 mg/dL (ref 9.0–28.0)
Bilirubin, Total: 0.8 mg/dL (ref 0.2–1.2)
CO2: 25 mEq/L (ref 17–29)
Calcium: 9.4 mg/dL (ref 8.5–10.5)
Chloride: 107 mEq/L (ref 99–111)
Creatinine: 0.9 mg/dL (ref 0.5–1.5)
Globulin: 3.7 g/dL — ABNORMAL HIGH (ref 2.0–3.6)
Glucose: 95 mg/dL (ref 70–100)
Potassium: 5 mEq/L (ref 3.5–5.3)
Protein, Total: 8 g/dL (ref 6.0–8.3)
Sodium: 141 mEq/L (ref 135–145)
eGFR: >60 mL/min/{1.73_m2} (ref >=60)

## 2022-08-12 LAB — LITHIUM LEVEL: Lithium Level: 0.426 mEq/L — ABNORMAL LOW (ref 1.000–1.200)

## 2022-08-12 LAB — HEMOLYSIS INDEX: Hemolysis Index: 33 Index — ABNORMAL HIGH (ref 0–24)

## 2022-08-12 LAB — LIPID PANEL
Cholesterol / HDL Ratio: 4.2 Index
Cholesterol: 212 mg/dL — ABNORMAL HIGH (ref 0–199)
HDL: 51 mg/dL (ref 40–9999)
LDL Calculated: 139 mg/dL — ABNORMAL HIGH (ref 0–99)
Triglycerides: 109 mg/dL (ref 34–149)
VLDL Calculated: 22 mg/dL (ref 10–40)

## 2022-08-12 LAB — VITAMIN B12: Vitamin B-12: 276 pg/mL (ref 211–911)

## 2022-08-12 LAB — VITAMIN D,25 OH,TOTAL: Vitamin D, 25 OH, Total: 22 ng/mL — ABNORMAL LOW (ref 30–100)

## 2022-08-12 LAB — HEMOGLOBIN A1C
Average Estimated Glucose: 91.1 mg/dL
Hemoglobin A1C: 4.8 % (ref 4.6–5.6)

## 2022-08-12 NOTE — Progress Notes (Addendum)
Blood drawn using aseptic technique from right antecubital without complication on 1st attempt. Patient tolerated procedure and left in stable condition. Labs sent to [facility].    Additional labs ordered per lab orders placed by Dr. Veneta Penton, Advanced MD.      Noralee Chars also performed.

## 2022-08-12 NOTE — Progress Notes (Signed)
Hi Sanay,    The labs look good overall and we will discuss in detail at your upcoming visit.     Please touch base with your psychiatrist to share the Lithium level to see if an adjustment is needed. If you prefer for [place] to fax the results to him, please provide [place] with his fax number.     Let me know if you have any questions.    Best,  Dr. Trina Ao

## 2022-08-12 NOTE — Addendum Note (Signed)
Addended by: Florentina Jenny on: 08/12/2022 10:07 AM     Modules accepted: Orders

## 2022-08-15 LAB — THYROGLOBULIN ANTIBODY: Thyroglobulin Antibodies: <1 IU/mL (ref <=1)

## 2022-08-16 ENCOUNTER — Encounter: Payer: Self-pay | Admitting: Family Medicine

## 2022-08-16 NOTE — Progress Notes (Unsigned)
[facility] PHYSICAL EXAM - New    Date Time: 09/09/2022 10:25 AM  Patient Name: Walter Villarreal, Walter Villarreal    Subjective:   Patient is a 26 y.o. male who presents for a routine physical examination.    New patient presents for routine checkup    NURSING TRIAGE  Depression Screening  Completed 2/2 Patient has Hx of bipolar  Mental health:  Bipolar 1  -In process of finding new psychiatrist   -Ongoing with Lithium   -Needs new psych to help him find optimal Li dosage     Audiology test   Hearing test administered to patient. Went over results with patient in a general form by nurse. Results reviewed with patient by physician in detail.     -Mania state can result in auditory hallucinations (intermittent)  -Wax buildup     Vision test  Vision test administered to patient. Went over results with patient in a general form by nurse. Results reviewed with patient by physician in detail.     -WNL                 Fitness / Nutrition  Current Exercise regimen: 2 times a week MMA training, weights 1 day a week  Patient's diet - returned to plant based  Inbody discussed in detail and weight loss via calorie deficit discussed  Patient is engaged in regular exercise and will plan to continue     EKG  Not indicated at this time. Patient denies SOB, chest pains, or palpitations.       InBody  Scanned into patient's chart.     Advance Directive:   [x]  Patient does not have an advanced directive. [place] Advanced Medical Directive Form provided to patient.      EpWorth Sleep Scale  Total Score = 5  -No trouble with sleep in general  -Mild sleep apnea after sleep study  -Admits to poor sleep increasing potential for manic event    ROUTINE CANCER AND PREVENTATIVE HEALTH SCREENING  Cardiovascular:   -Denies concern    Prostate:   -Defer until 45    Colon:   -Defer until 45    Skin:   -Defer  -Seen by derm in the past  -No concerning history    Cognitive:   -Challenging as bipolar diagnosis makes it difficult at times  -Can be distracted, trouble  following through on things  -Struggling to focus at times  -Denies symptoms impacting work  -Algae based Omega 3 to help improve cognitive function   -Trying best to stay organized     Bone density:   -Defer    Immunizations:   -Covid vaccine  -Flu vaccine discussed  -HPV (completed), Tdap also addressed (had it recently - past few years)    Immunization History   Administered Date(s) Administered    COVID-19 mRNA MONOVALENT vaccine PRIMARY SERIES 12 years and above (Moderna) 100 mcg/0.5 mL 12/16/2020     Health Maintenance   Topic Date Due    COVID-19 Vaccine (2 - Moderna series) 02/10/2021    INFLUENZA VACCINE  Never done    DEPRESSION SCREENING  09/09/2023       Patient concerns    Chief Complaint:  Annual Exam (Review labs)    Work: Airline pilot - Microbiologist (Prop management; Entity level accounting) - Investing;   Education: [employer] - [place]  Places lived: [place], [place], [place] ([employer] - [employer]), [place]   Hobbies: dog and girlfriend, reading, hanging with friends  Labs  -LDL (139)  -B12 (276)  -Vitamin D (22)    Low B12  -Discussed options of oral vs inj supplementation  AP  -Trial of B12 INJ     Low vitamin D  -Discussed importance of vitamin D in optimizing bone density and preventing osteoporosis  AP  -Consider total daily dosage of Vitamin D3 5,000 IU daily    Hx of severe hip injury  -Left hip with muscle sepration tear  -Tighter iliopsoas, primarily iliacus  -Trouble with hip flexion on left vs. Right  -Weaker on L v. R     Hx of Thumb fractures  -Bilateral      Numbness BL shins  -Less sensation past knees  -Secondary to lacrosse   -Multiple splinter fractures     .  HISTORY:  Past Medical History:   Diagnosis Date    Bipolar 1 disorder      Past Surgical History:   Procedure Laterality Date    TONSILLECTOMY, ADENOIDECTOMY       Family History   Problem Relation Age of Onset    Hypertension Mother     Scoliosis Mother     Other Father         Knee and vertebrae issues    Hypertension Father      Sleep apnea Father     Suicidality Sister     ADD / ADHD Sister     Depression Sister     Bipolar disorder Maternal Grandfather     Bipolar disorder Paternal Grandfather     Bipolar disorder Paternal Uncle      Social History     Tobacco Use    Smoking status: Never    Smokeless tobacco: Never   Vaping Use    Vaping Use: Former   Substance Use Topics    Alcohol use: Yes     Alcohol/week: 2.0 standard drinks of alcohol     Types: 2 Cans of beer per week    Drug use: Not Currently     Social History     Substance and Sexual Activity   Sexual Activity Not on file       History reviewed    Physical Exam:   Visit Vitals  BP 128/78 (BP Site: Right arm, Patient Position: Sitting, Cuff Size: Large)   Pulse (!) 59   Temp 97.8 F (36.6 C) (Temporal)   Ht 1.791 m (5' 10.5")   Wt 122.5 kg (270 lb)   SpO2 98%   BMI 38.19 kg/m       General appearance - alert and appropriate, comfortable, obese  Eyes - pupils equal, round, and reactive to light, normal conjunctiva, EOM intact  HENT- neck supple, NC/AT, oropharynx WNL, TM intact, MMM  CV - normal S1, S2, no murmurs  Resp - normal respiratory effort, clear breath sounds bilaterally   Musculoskeletal: extremities +5/5 UE/LE flexion/extension; Left weaker than R  Skin - warm, moist, ance around neck line, non cystic  PSYCH: normal mood and affect, insight and judgement appropriate for situation   Neuro - alert and oriented x 3, decreased sensation BL shin  BL thumb with increased extension     Assessment and Plan:     1. Routine physical examination        2. Bipolar 1 disorder        3. Family history of hemochromatosis        4. Class 2 severe obesity due to excess calories with serious comorbidity and body  mass index (BMI) of 38.0 to 38.9 in adult        5. Low serum vitamin B12  vitamin B-12 (CYANOCOBALAMIN) injection 1,000 mcg      6. Low energy  Intrinsic Factor Blocking Antibody    TSH, Abn Reflex to Free T4, Serum    Intrinsic Factor Blocking Antibody    Intrinsic Factor  Blocking Antibody    TSH, Abn Reflex to Free T4, Serum    CANCELED: TSH      7. Acne vulgaris  clindamycin (Clindagel) 1 % gel      8. Mild sleep apnea        9. Low vitamin B12 level        10. Vitamin D deficiency        11. History of multiple concussions        12. Left hip pain        13. Mild scoliosis        14. History of thumb fracture        15. Lower extremity numbness          Check IF given low B12 and ongoing supp/diet with high B12  Check TSH for low energy  Start B12 inj  Refer to psych for bipolar review and medication management  Start clindamycin in conjunction with benzyl peroxide for acne treatment    Labs  -LDL (139)  -B12 (276)  -Vitamin D (22)    Low B12  -Discussed options of oral vs inj supplementation  AP  -Trial of B12 INJ     Low vitamin D  -Discussed importance of vitamin D in optimizing bone density and preventing osteoporosis  AP  -Consider total daily dosage of Vitamin D3 5,000 IU daily    Hx of severe hip injury  -Left hip with muscle sepration tear  -Tighter iliopsoas, primarily iliacus  -Trouble with hip flexion on left vs. Right  -Weaker on L v. R     Hx of Thumb fractures  -Bilateral      Numbness BL shins  -Less sensation past knees  -Secondary to lacrosse   -Multiple splinter fractures     Routine physical exam  -Routine labs discussed in detail with patient and all questions/concerns answered  -Health maintenance discussed in detail  -Health screenings and immunizations discussed and reviewed with patient    Risks and benefits of medical care (including but not limited to vaccinations, medications, blood tests, imaging, offsite referrals, etc.) discussed in detail with the patient. All questions and concerns were satisfactorily addressed as per the patient. The patient acknowledges and understands the treatment plan discussed during today's visit and if the patient has any adverse outcome or worsening condition, the patient will follow up immediately in the clinic or at an  ER/Urgent care facility.    Signed by: Justin Mend, DO, MPH  Family Medicine  [facility] - New

## 2022-08-20 ENCOUNTER — Encounter: Payer: Self-pay | Admitting: Family Medicine

## 2022-08-20 DIAGNOSIS — F314 Bipolar disorder, current episode depressed, severe, without psychotic features: Secondary | ICD-10-CM

## 2022-08-20 MED ORDER — LITHIUM CARBONATE ER 300 MG PO TBCR
300.0000 mg | EXTENDED_RELEASE_TABLET | Freq: Two times a day (BID) | ORAL | 0 refills | Status: DC
Start: 2022-08-20 — End: 2022-09-08

## 2022-08-24 NOTE — Addendum Note (Signed)
Addended by: Justin Mend on: 08/24/2022 04:53 PM     Modules accepted: Orders

## 2022-09-02 ENCOUNTER — Encounter: Payer: Self-pay | Admitting: Family Medicine

## 2022-09-02 DIAGNOSIS — F319 Bipolar disorder, unspecified: Secondary | ICD-10-CM

## 2022-09-03 ENCOUNTER — Telehealth: Payer: Self-pay | Admitting: Family Medicine

## 2022-09-03 ENCOUNTER — Encounter (INDEPENDENT_AMBULATORY_CARE_PROVIDER_SITE_OTHER): Payer: Self-pay

## 2022-09-03 NOTE — Telephone Encounter (Signed)
From: Joan Mayans   Sent: Friday, September 03, 2022 9:07 AM  To: '[email or web address]  Subject: [facility] - Virtual Visit     Dear Mr. Ebin Palazzi,      We have you scheduled for a virtual visit with Rosina Lowenstein, LCSW on Tuesday, September 19, 3:00pm. For virtual visits with your therapist, you will need to be physically located in [place].     Your Next Steps:   You will need to sign and return forms attached to this message.  You will need to complete eCheck-In, please do not bypass any prompted forms.  Your video visit will be available to access through MyChart, there is no need to download a separate video app when using MyChart.    The following attached items, will require your attention:   "Integrated-Patient to Keep" -- These are for informational purposes and our review only. This item does not need to be returned.  "Integrated Intake Forms-Patient to sign" -- These forms need to be completed and returned PRIOR TO YOUR APPOINTMENT. Failure to return them may result in needing to reschedule.  "Informed Consent Form [facility] Therapy-Fillable" -- This form needs to be completed at the beginning of your appointment with your therapist and returned.  Please note, your appointment may be rescheduled if your "Integrated Intake Forms-Patient to sign" forms are not completed prior to your appointment. You may respond to this message with your completed forms attached.     MyChart Video Visit Instructions: Up to 15 minutes before your appointment (and no later than 15 minutes after):  Select your specific appointment  Select BEGIN VISIT-at this time it will bring you into the appointment  Select JOIN, and wait for your therapist to arrive  You can always call [phone]) and select option 4 to speak to a MyChart representative. A tip sheet with visual instructions can be found here: [email or web address]    Thank you,     Natalia Leatherwood  L. [facility]  Patient Access Associate [address], [address]   [place], [place] [address]  (T) [phone] (F) [phone]

## 2022-09-06 ENCOUNTER — Encounter (INDEPENDENT_AMBULATORY_CARE_PROVIDER_SITE_OTHER): Payer: Self-pay

## 2022-09-07 ENCOUNTER — Telehealth (INDEPENDENT_AMBULATORY_CARE_PROVIDER_SITE_OTHER): Payer: Commercial Managed Care - POS | Admitting: Clinical

## 2022-09-07 ENCOUNTER — Ambulatory Visit (INDEPENDENT_AMBULATORY_CARE_PROVIDER_SITE_OTHER): Payer: Commercial Managed Care - POS | Admitting: Clinical

## 2022-09-07 DIAGNOSIS — F331 Major depressive disorder, recurrent, moderate: Secondary | ICD-10-CM

## 2022-09-07 DIAGNOSIS — F314 Bipolar disorder, current episode depressed, severe, without psychotic features: Secondary | ICD-10-CM

## 2022-09-07 DIAGNOSIS — F319 Bipolar disorder, unspecified: Secondary | ICD-10-CM

## 2022-09-08 ENCOUNTER — Encounter: Payer: Self-pay | Admitting: Family Medicine

## 2022-09-08 ENCOUNTER — Ambulatory Visit (FREE_STANDING_LABORATORY_FACILITY): Payer: Commercial Managed Care - POS | Admitting: Family Medicine

## 2022-09-08 ENCOUNTER — Encounter (INDEPENDENT_AMBULATORY_CARE_PROVIDER_SITE_OTHER): Payer: Self-pay | Admitting: Clinical

## 2022-09-08 ENCOUNTER — Telehealth: Payer: Self-pay | Admitting: Family Medicine

## 2022-09-08 ENCOUNTER — Other Ambulatory Visit: Payer: Self-pay

## 2022-09-08 VITALS — BP 128/78 | HR 59 | Temp 97.8°F | Ht 70.5 in | Wt 270.0 lb

## 2022-09-08 DIAGNOSIS — Z8781 Personal history of (healed) traumatic fracture: Secondary | ICD-10-CM

## 2022-09-08 DIAGNOSIS — L700 Acne vulgaris: Secondary | ICD-10-CM

## 2022-09-08 DIAGNOSIS — R200 Anesthesia of skin: Secondary | ICD-10-CM

## 2022-09-08 DIAGNOSIS — F319 Bipolar disorder, unspecified: Secondary | ICD-10-CM

## 2022-09-08 DIAGNOSIS — E538 Deficiency of other specified B group vitamins: Secondary | ICD-10-CM

## 2022-09-08 DIAGNOSIS — R5383 Other fatigue: Secondary | ICD-10-CM

## 2022-09-08 DIAGNOSIS — Z6838 Body mass index (BMI) 38.0-38.9, adult: Secondary | ICD-10-CM

## 2022-09-08 DIAGNOSIS — E559 Vitamin D deficiency, unspecified: Secondary | ICD-10-CM

## 2022-09-08 DIAGNOSIS — Z0000 Encounter for general adult medical examination without abnormal findings: Secondary | ICD-10-CM

## 2022-09-08 DIAGNOSIS — G4730 Sleep apnea, unspecified: Secondary | ICD-10-CM

## 2022-09-08 DIAGNOSIS — Z87820 Personal history of traumatic brain injury: Secondary | ICD-10-CM

## 2022-09-08 DIAGNOSIS — M419 Scoliosis, unspecified: Secondary | ICD-10-CM

## 2022-09-08 DIAGNOSIS — M25552 Pain in left hip: Secondary | ICD-10-CM

## 2022-09-08 DIAGNOSIS — Z8349 Family history of other endocrine, nutritional and metabolic diseases: Secondary | ICD-10-CM

## 2022-09-08 LAB — THYROID STIMULATING HORMONE (TSH), REFLEX ON ABNORMAL TO FREE T4, SERUM: TSH, Abn Reflex to Free T4, Serum: 1.79 u[IU]/mL (ref 0.35–4.94)

## 2022-09-08 MED ORDER — CLINDAMYCIN PHOSPHATE 1 % EX GEL
Freq: Two times a day (BID) | CUTANEOUS | 2 refills | Status: AC
Start: 2022-09-08 — End: ?

## 2022-09-08 MED ORDER — CYANOCOBALAMIN 1000 MCG/ML IJ SOLN
1000.0000 ug | INTRAMUSCULAR | Status: DC
Start: 2022-09-08 — End: 2022-09-20
  Administered 2022-09-08: 1000 ug via INTRAMUSCULAR

## 2022-09-08 NOTE — Progress Notes (Unsigned)
Depression Screening  Completed 2/2 Patient has Hx of bipolar    Audiology test   Hearing test administered to patient. Went over results with patient in a general form by nurse. Results reviewed with patient by physician in detail.       Vision test  Vision test administered to patient. Went over results with patient in a general form by nurse. Results reviewed with patient by physician in detail.        Fitness / Nutrition  Current Exercise regimen: 2 times a week MMA training, weights 1 day a week    Patient's diet - returned to plant based    EKG  Not indicated at this time. Patient denies SOB, chest pains, or palpitations.      InBody  Scanned into patient's chart.    Advance Directive:   [x]  Patient does not have an advanced directive. [place] Advanced Medical Directive Form provided to patient.     EpWorth Sleep Scale    Total Score = 5    Sitting and Reading = 1    Watching TV= 1    Sitting Inactive in a public place (eg. A theater or meeting)= 0    As a passenger in a car for an hour without a break = 0    Lying Down to rest in the afternoon when the circumstances permit = 3    Sitting and talking to someone= 0    Sitting Quietly after lunch without alcohol= 0    In a car, while stopped for a few minutes in traffic= 0

## 2022-09-08 NOTE — Telephone Encounter (Signed)
From: Joan Mayans   Sent: Wednesday, September 08, 2022 11:24 AM  To: '[email or web address]  Subject: [facility] - Annual Exam Appointment    Dear Mr. Maxen Rowland,    Your [facility] laboratory and health exam appointments have been scheduled for you on the date and times listed below. Both visits will take place in our [facility] [place] office located at: [address], [place], [place] [address]. [employer].     Fasting Lab Appointment:  Tuesday, September 06, 2023, 8:00am  (please plan 30 minutes for this appointment)    To Prepare for Your Appointment:  Do not eat or drink anything containing calories after midnight on the night prior to your appointment.  You may have water, black coffee, and your morning medications up to an hour before your appointment.    [facility] Exam:  Tuesday, September 13, 2023, 8:30am  (please plan 2 hours for this appointment). More information on exam components.       Please bring a government-issued picture ID and your insurance card with you. To ensure a smooth check-in process and to minimize the handling of forms, please utilize MyChart to access your account and complete the e-check in process with registration forms in advance of your visit. We encourage you to contact our offices with any questions surrounding these forms and any items not completed can/will be obtained on site at the time of your visit. Access to MyChart FAQ's and assistance.     Please review your 360 Wellness Options which are covered annually through your membership as part of your annual exam. We encourage you to take advantage of the benefit of your choice! Further information on registration and scheduling can be obtained via our offices and your clinical team.      Important special instructions for the day of your appointment:  When entering the parking garage you will receive a ticket, please bring this  ticket with you to your visit. Our office can validate your parking and the ticket will be used again to exit the lot at no cost to you.      Courtesy note: Should you need to reschedule, please provide as much notice as possible by calling [phone] at least 3 business days before the exam.     We at 360 look forward to seeing you!    Katherine L. [facility]  Patient Access Associate [address] [place], [address]   [place], [place] [address]  (T) [phone] (F) [phone]

## 2022-09-08 NOTE — Progress Notes (Unsigned)
[facility] Comprehensive Therapy Evaluation    09/08/2022    Start:  3:00PM End:   3:53PM    *Not all intake questions were answered due to time constraint.  Will complete at a follow-up visit.    Referred by:  Dr. Tana Felts, a 26 y.o. male, single, Caucasian, heterosexual, identies gender as "male" and denies concerns about own gender identity presenting for initial evaluation.    Patient was unaccompanied.    Interpreter present? no    Intake Behavioral Health Registration:  Telemedicine: yes    Intake Behavioral Health Registration forms/documentation have be reviewed. yes    Chief Complaint:     Walter Villarreal is a 26 years old male presents therapy due to chronic depression. Pt reports he was diagnosed with Bipolar 1 Disorder when was 26 years old. Patient shares was admitted to the hospital due to a manic episode where he describes he wasn't sleeping for 4 days, "partying every night," was ore talkative, and excessive drinking and drug use. Pt shares he was prescribed Lithium, which has helped in managing the manic episodes, though he continues feel extreme depression. Pt reports on set of depression when he was 39-8 years old and states his "resting baseline is pretty depressed." He endorsed difficulties getting out of bed, apathy, anhedonia, difficulties with sleep, and recurrent thoughts of death. Shares therapy and medications have not been helping with depression in the past.    Patient shared he attempted suicide when he was college by mixing medication with alcohol. Currently patient shares he has thoughts of dying and passive suicidal ideation but states he's "apathetic to the idea of dying." Pt denies currently plan or intent. LCSW discussed and assessed for safety risk and issues. Pt does not appear to be a harm to himself or other at this time.       Date of Onset: depression - when he was young; bipolar - when he was 26 years old    Duration of Problems: unchanged, Persistent - Greater than 12 month Duration      History of Presenting Illness:     Patient reported concerns with Little interest or pleasure in doing things, Feeling down, depressed, or hopeless, Trouble falling, Staying asleep, Sleeping too much, Feeling tired or having little energy, Poor appetite/overeating , Feeling bad about yourself or that you are a failure or have let yourself or your family down, Trouble concentrating on things, such as reading the newspaper or watching television, Moving or speaking so slowly that other people could have noticed? , Being so fidgety or restless that you have been moving around a lot more than usual, and Thoughts that you would be better off dead or of hurting yourself in some way  Feeling nervous, anxious or on edge, Not being able to stop or control worrying , Worrying too much about different things, Trouble relaxing, and Becoming easily annoyed or irritable     Patient reported these symptoms have been unchanged since he was 26 years old (onset of bipolar dx)     Patient reported possible precipitating event(s) include the following: manic episode    Current Stressors:  chronic depression       Past Psychiatric Symptoms:    Anxiety:  irritability  racing thoughts  Eating Disorders: Denies associated symptoms.  Depression: anhedonia  depressed mood  difficulty concentrating  fatigue  feelings of worthlessness/guilt  hopelessness  hypersomnia  recurrent thoughts of death  suicidal  attempt  suicidal thoughts without plan  Mania: Distractibility  Decreased need for sleep  Flight of ideas  Increased goal directed activities  OCD: NA  Psychosis: NA  Other: NA    Mental Health Treatment History and Outcomes:  Previous Treatment/diagnosis: yes Bipolar I   Current Psychiatrist: no  Previous Psychiatrist: yes  Previous hospitalizations: yes   Past OP MH Treatment: no   Other: NA    Trauma:  Victim of abuse: no, none   Reexperiencing:  N\A      Psychosocial History:     Patient currently lives in/with: apartment self  Children: 0  Pets: dog  Patient is originally from:  Born in [place], 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 Older sister passed away when pt was 9 years old from suicide.   Quality of Social Relationships: Good  Work status: currently employed  Education: Museum/gallery curator affiliation: [employer] & Leisure activities: modeling and painting, board game, spending time with friends, spend time with Psychiatric nurse Needs? Private Vehicle - Patient to drive self     Family History of Death by Suicide/ Homicide: Older sister passed away from suicide    Family History of Mental Illness: Significant:  sister with derpession    Family History of Substance Use: Patient denied     History and Current Substance Use:     Substance Use History: no  Use of caffeine: coffee 1 cup /day  Tobacco use: no  Other: NA    Legal History: no   Legal status: The patient has no significant history of legal issues. (past or pending charges, convictions, probation or parole status)      Mental Status Exam:     General Appearance: neatly groomed, adequately nourished, and casually dressed  Behavior/Psychomotor: normal  and good eye contact  Mood: euthymic  Affect: congruent with mood  Speech: normal pitch and normal volume  Language: verbal expression is clear , has comprehensible ideas through verbal articulation, and ideas are conveyed and properly produced  Thought Form/Process: well organized, associations intact, and goal directed  Thought Content: absent of psychotic symptoms and absent of suicidal or homicidal ideation  Perception /Sensorium: clear, intact and makes sense of stimuli received  Judgment: good  Insight: good  Cognitive Functioning: cognition grossly intact, alert, oriented to person, oriented  to place, oriented to time, oriented to situation, memory intact, and attentive    Medications on File Prior to Visit:  Current Outpatient Medications on File Prior to Visit   Medication Sig Dispense Refill    [DISCONTINUED] lithium (LITHOBID) 300 MG CR tablet Take 1 tablet (300 mg) by mouth 2 (two) times daily Increase to 300 mg in am, 600 mg hs after 5 days. 180 tablet 0    [DISCONTINUED] QUEtiapine (SEROquel) 50 MG tablet Take 0.5 tablets (25 mg total) by mouth nightly as needed (sleep/mood/anxiety/anger) May Increase 25 mg nightly if tolerated up to 100 mg. May use 25 or 50 mg in day time for agitation. 60 tablet 2     No current facility-administered medications on file prior to visit.       Medical History Relevant to Behavioral Health:  NA     Past Medical Problems:   Past Medical History:   Diagnosis Date    Bipolar 1 disorder  Risk Assessment:     Risk factors: Major depressive episode, Mixed affect episode (e.g. Bipolar), and Family history of suicide  Protective Factors: Identifies reasons for living Responsibility to family or others; living with family, Supportive social network of family or friends, and Engaged in work or school  Suicide Attempts/Gestures/Thoughts/Plan: -Suicidal thoughts, but no intent or plan  Homicidal Thoughts/Plan/Gestures: -No homicidal thoughts, no intent, no plan.  Recent Physical Aggression/Violence/Anger: Denies associated symptoms.  Access to Weapons: none    Assessment:     Diagnostic Summary:  Patient diagnosed with Adjustment Disorder based upon symptoms reported as noted in narrative section above.   Referral Needed: no; If yes, include in Recommendations and Follow Up section below.  Patient will begin treatment to reduce symptoms and improve functioning.    Completed Screening Measures:       09/08/2022     8:38 AM 09/07/2022     2:57 PM   PHQ2/PHQ9 SCORE    PHQ2 Score 6 6   PHQ9 Score 6 23         09/07/2022     3:34 PM   GAD-7 Scores   Feeling afraid as if  something awful might happen 0   Being so restless that it is hard to sit still 0   Trouble relaxing 3   Worrying too much about different things 2   Not being able to stop or control worrying 2   Feeling nervous, anxious or on edge 2   GAD-7 Total Score 10   Becoming easily annoyed or irritable 1       Treatment Disposition and Plan:       We discussed the diagnosis, the reasonable treatment options, the current treatment recommendations.     Patient's Stated Treatment Goals:   Managing depression Depression: acquiring relapse prevention skills, increasing interest in usual activities, and reducing negative automatic thoughts    [] Psychotherapy 1x weekly       [x] Psychotherapy 2x monthly   [] Psychotherapy 1x monthly  []  No future appointments made by patient    Education about signs/symptoms of illness and Exploring progress towards goals and refining patient identified next steps    Anticipated achievement date: 11/07/22      Collaboration with Providers:  Providers contacted: no,    Guidelines of Therapy:  Care Coordination was reviewed with patient. Confidentiality and its limits were reviewed as well as informed consent for treatment.     The patient demonstrated capacity, understood and agreed with this plan of care and practice guidelines. An opportunity for questions was given. The patient is safe to leave from appointment.    This Clinical research associate reviewed [employer] including the available [facility], the [facility], and Urgent Care services.  This Clinical research associate also reviewed guidelines if patient has urgent concerns or in the event of an emergency situation including calling 9-1-1 or going to the closest emergency room if Elena Cothern is an imminent danger to self or others. [place] [place] acknowledged understanding of these services and plan.        Recommendations and Follow Up:  Follow-Up Appointment: 09/17/22   Medication Management   Therapy  (Individual)      Jorene Minors, LCSW

## 2022-09-08 NOTE — Progress Notes (Addendum)
B12 Injection given first in left deltoid.    Blood drawn using aseptic technique from right antecubital without complication on 1st attempt. Patient tolerated procedure and left in stable condition. Labs sent to [facility].

## 2022-09-09 ENCOUNTER — Encounter: Payer: Self-pay | Admitting: Family Medicine

## 2022-09-09 DIAGNOSIS — Z8349 Family history of other endocrine, nutritional and metabolic diseases: Secondary | ICD-10-CM | POA: Insufficient documentation

## 2022-09-09 DIAGNOSIS — Z87820 Personal history of traumatic brain injury: Secondary | ICD-10-CM | POA: Insufficient documentation

## 2022-09-09 DIAGNOSIS — E559 Vitamin D deficiency, unspecified: Secondary | ICD-10-CM | POA: Insufficient documentation

## 2022-09-09 DIAGNOSIS — M25552 Pain in left hip: Secondary | ICD-10-CM | POA: Insufficient documentation

## 2022-09-09 DIAGNOSIS — M419 Scoliosis, unspecified: Secondary | ICD-10-CM | POA: Insufficient documentation

## 2022-09-09 DIAGNOSIS — R200 Anesthesia of skin: Secondary | ICD-10-CM | POA: Insufficient documentation

## 2022-09-09 DIAGNOSIS — G4730 Sleep apnea, unspecified: Secondary | ICD-10-CM | POA: Insufficient documentation

## 2022-09-09 DIAGNOSIS — L700 Acne vulgaris: Secondary | ICD-10-CM | POA: Insufficient documentation

## 2022-09-09 DIAGNOSIS — R5383 Other fatigue: Secondary | ICD-10-CM | POA: Insufficient documentation

## 2022-09-09 DIAGNOSIS — Z8781 Personal history of (healed) traumatic fracture: Secondary | ICD-10-CM | POA: Insufficient documentation

## 2022-09-09 DIAGNOSIS — E538 Deficiency of other specified B group vitamins: Secondary | ICD-10-CM | POA: Insufficient documentation

## 2022-09-14 NOTE — Progress Notes (Signed)
Hi Jaidin,    TSH looks good, no cause for concern.    The lab test for a specific antibody that may be causing the B12 to be low was negative. My sense is hopefully you are seeing benefits with B12 inj and that we continue moving forward.     Let me know if you have any questions.    Best,  Dr. Trina Ao

## 2022-09-17 ENCOUNTER — Telehealth (INDEPENDENT_AMBULATORY_CARE_PROVIDER_SITE_OTHER): Payer: Commercial Managed Care - POS | Admitting: Clinical

## 2022-09-17 NOTE — Progress Notes (Deleted)
[facility]  Individual Therapy Note    Name:  Walter Villarreal   DOB: Feb 09, 1996  Medical Records: [id number]    Time in: 4:00PM   Time out:  ***    Telemedicine: Yes    Duration of Session: {attendance:21023055}    Goal(s) Addressed: Identification of cognitive distortions, Increased mindfulness Depression: acquiring relapse prevention skills, increasing interest in usual activities, and increasing motivation    Intervention: Cognitive-Behavioral Therapy strategies were implemented.     NARRATIVE:     Patient's presenting problem: He continues to meet criteria for the Bipolar I, Current or Most Recent Unspecified diagnoses. He continues to experience symptoms of: {Signs and Symptoms:21023014} and appears to be {Desc; clinical condition:2101701}. He noted {Desc; increased/decreased/absent/diminished:30183} psychosocial stressors including ***.       ASSESSMENT:     Completed Screening Measures:       09/08/2022     8:38 AM 09/07/2022     2:57 PM   PHQ2/PHQ9 SCORE    PHQ2 Score 6 6   PHQ9 Score 6 23         09/07/2022     3:34 PM   GAD-7 Scores   Feeling afraid as if something awful might happen 0   Being so restless that it is hard to sit still 0   Trouble relaxing 3   Worrying too much about different things 2   Not being able to stop or control worrying 2   Feeling nervous, anxious or on edge 2   GAD-7 Total Score 10   Becoming easily annoyed or irritable 1       Current Symptoms:  {BH Current Symptoms:46317}   {PHQ9Symptoms:46758}   {GAD7symptoms:46757}    Mental Status Exam:  General Appearance: neatly groomed, adequately nourished, and casually dressed  Behavior/Psychomotor: normal  and good eye contact  Mood: euthymic  Affect: congruent with mood and flat  Speech: normal pitch and normal volume  Language: verbal expression is clear , has comprehensible ideas through verbal articulation, and ideas are conveyed and properly produced  Thought Form/Process: well organized, associations  intact, and goal directed  Thought Content: absent of psychotic symptoms and absent of suicidal or homicidal ideation  Perception /Sensorium: clear, intact and makes sense of stimuli received  Judgment: good  Insight: good  Cognitive Functioning: cognition grossly intact, alert, oriented to person, oriented to place, oriented to time, oriented to situation, memory intact, and attentive    Safety Risks:    Suicidal ideation? No   Homicidal ideation? No   Thoughts of harm to others? No   Thoughts of self injury? No   Substance abuse cravings? No     Risk factors include: Major depressive episode, Mixed affect episode (e.g. Bipolar), and Family history of suicide  Protective factors include: Identifies reasons for living Responsibility to family or others; living with family, Supportive social network of family or friends, and Engaged in work or school    Patient was found to be at no significant risk for danger to self or danger to others and is safe for treatment in the outpatient level of care.     Response to intervention/skills and/or knowledge gained: Saburo appeared open to feedback and intervention.        TREATMENT PLAN:     Patient Stated Goals Addressed: Manage depression    [] Psychotherapy 1x weekly       [] Psychotherapy 2x monthly   [] Psychotherapy 1x monthly  [] Plan to Discharge     []   No future appointments made by patient    Education about signs/symptoms of illness, Eliciting positive experiences and exceptions to problematic symptoms, CBT emotion identification tools, and CBT cognitive coping tools     He is applying the therapeutic interventions evidenced by: active and open discussion regarding skills used in session.    Progress Towards Goals: Stable/Improved; at goal    PLAN:     Follow up appointment: Date and Time:***  Patient aware to call 911 or ER in case of psychiatric emergency.  Continue psychotherapy with the patient and to emphasize the importance of implementing psychotherapeutic  techniques at home and work.     Jorene Minors, LCSW

## 2022-09-20 ENCOUNTER — Encounter (INDEPENDENT_AMBULATORY_CARE_PROVIDER_SITE_OTHER): Payer: Self-pay | Admitting: Psychiatry

## 2022-09-20 ENCOUNTER — Telehealth (INDEPENDENT_AMBULATORY_CARE_PROVIDER_SITE_OTHER): Payer: Commercial Managed Care - POS | Admitting: Psychiatry

## 2022-09-20 DIAGNOSIS — F319 Bipolar disorder, unspecified: Secondary | ICD-10-CM

## 2022-09-20 DIAGNOSIS — F4321 Adjustment disorder with depressed mood: Secondary | ICD-10-CM

## 2022-09-20 MED ORDER — LITHIUM CARBONATE 300 MG PO CAPS
900.0000 mg | ORAL_CAPSULE | Freq: Every day | ORAL | 1 refills | Status: DC
Start: 2022-09-20 — End: 2022-11-04

## 2022-09-20 MED ORDER — LURASIDONE HCL 20 MG PO TABS
20.0000 mg | ORAL_TABLET | Freq: Every day | ORAL | 1 refills | Status: DC
Start: 2022-09-20 — End: 2022-11-04

## 2022-09-20 NOTE — Progress Notes (Signed)
[facility] Behavioral Health Psychiatric Evaluation    Date/Time:   09/20/2022  2:38 PM  Name:  JABEZ, MOLNER  MRN:  [id number]  Age:   26 y.o.  DOB:   1996/12/02  SEX:   male    Verbal Informed Consent:  During COVID-19 pandemic, requirement for written consent has been waived. Provider will review the following documents verbally with patient prior to starting this evaluation.     Telemedicine:   Verbal consent has been obtained from the patient to conduct video visit.  yes    Informed Consent for Psychotropic Medications:  Review of informed consent psychotropic medications form was completed verbally:   yes    Care Coordination:   Review of Care Coordination form was completed verbally:   yes    Source of information: Chart review, patient    CHIEF COMPLAINT: I was diagnosed with bipolar.    HISTORY OF PRESENT ILLNESS:   Tyr Franca,  a 26 y.o. male with history of bipolar disorder type I, complicated bereavement, presents for psychiatric intake evaluation. Patient was referred by [facility].    He reports that he has been diagnosed with bipolar disorder type I and he is unsure whether he has bipolar 1 or bipolar 2 disorder.  He reports that he had high mood elevation episode lasting for 3 to 4 days and during that timeframe he was hospitalized and was medicated with reporting feeling drugged up and stayed in the hospital for 1 week.  Reports that during that episode he had less need for sleep with more energy and able to finish her schoolwork with getting good grades, pressured speech, difficulty with communication that was realized by parents and cops were called and was taken to Hospital.  He reports another episode lasting for 1 to 2 days.  Denies any other high mood elevation episode.  Reports problem with severe depression with low energy, low motivation, chronic hopeless feeling since his sister committed suicide who was 5 years older than him.  Currently he is endorsing PHQ-9 score of 20 with reporting  problem with interest in activity, feeling sad, low energy, low motivation, feeling hopeless, feeling life is not worth living chronic in nature but denies currently any intention or plan being at work place.  He reports that previously 1 time he tried to commit suicide by collecting lithium and overdosing on lithium with alcohol.  Denies any other suicide attempt.  He reports some underlying anxiety but not controlling his day-to-day life.  Denies psychotic symptoms.  Screen negative for OCD, PTSD.  He is currently on lithium taking 900 mg daily.  Last lithium level was 0.426.  He reports problem with getting tremor and feeling shaky on lithium.  Reports not getting any manic episode but denies lithium being effective towards depression.  He reports previously tried Lamictal that was nauseous, Seroquel was causing drowsiness and feeling zombie, Wellbutrin, Vyvanse causing jitteriness with no benefit, Abilify at different time.  Reports that previously he has tried Facilities manager but none now the counselors were helpful.  During session he got upset about diagnosis of bipolar disorder type I.  ADDITIONAL PSYCHIATRIC REVIEW OF SYMPTOMS:   Psychosis Hallucinations - No evidence of hallucinosis  Delusions -  no delusional thoughts.  Reporting feeling hopeless and helpless with feeling life is not worth living.   Manic Symptoms He had manic episode with hospitalization in the past.  Currently denies any manic symptoms and mainly reporting depressive symptoms.   Trauma/Other Relevant Sister committed  suicide   Suicidal Suicidal Thoughts? yes chronic in nature with feeling hopeless but reports feeling safe at work place  Suicidal Plan?  no   Homicidal Ideations no         PAST PSYCHIATRIC HISTORY:    Diagnoses:   Diagnosed with bipolar disorder type I   Outpatient Providers: He was seen at [facility].  He reports previously seen by different 15 counselors but it was not effective.  He has been following with same counselor for  the last 1 year.   Medication History: He reports previously tried Lamictal that was nauseous, Seroquel was causing drowsiness and feeling zombie, Wellbutrin, Vyvanse causing jitteriness with no benefit, Abilify at different time.  He is currently on lithium 900 mg daily and reports that previously lithium level was between 0.6 to 0.8 but last lithium level was 0.426.  Reports tremor as a side effect   Hospitalizations: He had 1 hospitalization due to manic episode at [facility]  Suicide Attempts Yes: Tried to overdose on lithium with alcohol   Self Injury No   Suicide Exposure Yes: Sister committed suicide   Violence to Others No   Head Injuries/Seizures No     SUBSTANCE ABUSE HISTORY:     Alcohol Previously more drinking but lately within limit socially   Cannabis No   Cocaine No   Heroin No   Sedatives No   Other No   Tobacco Social History     Tobacco Use   Smoking Status Never   Smokeless Tobacco Never      Treatments None     MEDICAL HISTORY:     Past Medical History:   Diagnosis Date    Bipolar 1 disorder      Past Surgical History:   Procedure Laterality Date    TONSILLECTOMY, ADENOIDECTOMY       Allergies   Allergen Reactions    Other Environmental Other (See Comments)     Grasses and tree pollen     Outpatient Medications Marked as Taking for the 09/20/22 encounter (Telemedicine Visit) with Alena Bills, MD   Medication Sig Dispense Refill    clindamycin (Clindagel) 1 % gel Apply topically 2 (two) times daily 60 g 2    Docosahexaenoic Acid (Algal Omega-3 DHA) 200 MG Cap Take by mouth      lithium 300 MG capsule Take 3 capsules (900 mg) by mouth daily      vitamin D (CHOLECALCIFEROL) 25 MCG (1000 UT) tablet Take 1 tablet (1,000 Units) by mouth daily       Current Facility-Administered Medications for the 09/20/22 encounter (Telemedicine Visit) with Alena Bills, MD   Medication Dose Route Frequency Provider Last Rate Last Admin    [DISCONTINUED] vitamin B-12 (CYANOCOBALAMIN) injection  1,000 mcg  1,000 mcg Intramuscular Q30 Days Ambalam, Siva, DO   1,000 mcg at 09/08/22 1130       FAMILY HISTORY:     Family History   Problem Relation Age of Onset    Hypertension Mother     Scoliosis Mother     Other Father         Knee and vertebrae issues    Hypertension Father     Sleep apnea Father     Suicidality Sister     ADD / ADHD Sister     Depression Sister     Bipolar disorder Maternal Grandfather     Bipolar disorder Paternal Grandfather     Bipolar disorder Paternal Uncle  SOCIAL HISTORY:   Childhood/Developmental/Education: Raised by biological parent.  He has an Airline pilot degree.  Sister committed suicide.    Living Situation: Lives alone  Marital Status/Children/Supports: Unmarried and has no children   Employment: Worked as an Airline pilot currently at work Copywriter, advertising History: No      PSYCHIATRIC SPECIALTY & MENTAL STATUS EXAMINATION:     Vital Signs  There were no vitals taken for this visit.   General Appearance  Neatly groomed, appropriately dressed and adequately nourished   Behavior/Movements   Appropriate, no abnormal movements   Speech/Language  Normal Rate, Rhythm, Volume, and Tone; Appropriate Language Use   Thought Process  Logical, Linear, Goal Directed   Thought Content   No delusional thoughts.  Reports feeling hopeless and helpless with feeling life is not worth living but at present denies any intention or plan in place.   Perceptions  No evidence of hallucinosis   Insight/Judgment    Insight is fair.  Judgment is partial   Cognition  Sensorium clear, adequate fund of knowledge   Mood  Depressed   Affect  Limited / Constricted, mildly irritable     SCREENS/LABS/IMAGING:   PHQ-9:  Little interest or pleasure in doing things: 1 (09/20/2022  1:46 PM)  Feeling down, depressed, or hopeless: 3 (09/20/2022  1:46 PM)  Trouble falling or staying asleep, or sleeping too much: 0 (09/20/2022  1:46 PM)  Feeling tired or having little energy: 3 (09/20/2022  1:46 PM)  Poor appetite or  overeating: 3 (09/20/2022  1:46 PM)  Feeling bad about yourself - or that you are a failure or have let yourself or your family down: 3 (09/20/2022  1:46 PM)  Trouble concentrating on things, such as reading the newspaper or watching television: 2 (09/20/2022  1:46 PM)  Moving or speaking so slowly that other people could have noticed. Or the opposite - being so fidgety or restless that you have been moving around a lot more than usual: 2 (09/20/2022  1:46 PM)  Thoughts that you would be better off dead, or of hurting yourself in some way: 3 (09/20/2022  1:46 PM)  PHQ Total Score: (!) 20 (09/20/2022  1:46 PM)      Please Note: The PHQ-9 is a screening tool to aid in diagnosis and tracking of depressive disorders, and is not validated to independently determine level of risk.    Office Visit on 09/08/2022   Component Date Value Ref Range Status    Intrinsic Factor Blocking Antibody 09/08/2022 Negative  Negative Final    Comment:   For additional information, please refer to  http://education.[facility].com/faq/IFAB  (This link is being provided for informational/   educational purposes only.)    Test Performed by Clerance Lav,  [facility],  [address], [place], [place] [address]  Si Raider, M.D., Ph.D., Director of Laboratories  [phone], CLIA [phone]      TSH, Abn Reflex to Free T4, Serum 09/08/2022 1.79  0.35 - 4.94 uIU/mL Final   Clinical Support on 08/12/2022   Component Date Value Ref Range Status    Urine Type 08/12/2022 Clean Catch   Final    Color, UA 08/12/2022 Yellow   Final    Clarity, UA 08/12/2022 Turbid (A)  Clear - Hazy Final    Specific Gravity UA 08/12/2022 1.021  1.001 - 1.035 Final    Urine pH 08/12/2022 5.5  5.0 - 8.0 Final    Leukocyte Esterase, UA 08/12/2022 Negative  Negative Final  Nitrite, UA 08/12/2022 Negative  Negative Final    Protein, UR 08/12/2022 10= Trace (A)  Negative Final    Glucose, UA 08/12/2022 Negative  Negative Final    Ketones  UA 08/12/2022 Negative  Negative Final    Urobilinogen, UA 08/12/2022 Normal  mg/dL Final    Bilirubin, UA 08/12/2022 Negative  Negative Final    Blood, UA 08/12/2022 Negative  Negative Final    RBC, UA 08/12/2022 3 - 5  0 - 5 /hpf Final    Comment: 0-5 RBC/hpf may be regarded as normal in some patient  populations,e.g. menstruating women.Some specialty practices  may define 3-5 RBC/hpf as microscopic hematuria.      WBC, UA 08/12/2022 0-5  0 - 5 /hpf Final    Squamous Epithelial Cells, Urine 08/12/2022 0-5  0 - 25 /hpf Final    Urine Mucus 08/12/2022 Present  None Final    Vitamin D, 25 OH, Total 08/12/2022 22 (L)  30 - 100 ng/mL Final    Comment: Vitamin D, 25-OH, Total: Testing performed using Abbott  Alinity chemiluminescent Microparticle immunoassay methodology.  Method dependent minor difference may exist based on the test  platform used.  Vitamin D levels of <20 ng/ml are considered deficiency.  Vitamin D levels of 20-30 ng/ml suggest insufficiency.  Optimal Vitamin D levels are > or =30 ng/ml.      Vitamin B-12 08/12/2022 276  211 - 911 pg/mL Final    Cholesterol 08/12/2022 212 (H)  0 - 199 mg/dL Final    Triglycerides 08/12/2022 109  34 - 149 mg/dL Final    HDL 28/41/3244 51  40 - 9,999 mg/dL Final    Comment: An HDL cholesterol <40 mg/dL is low and constitutes a  coronary heart disease risk factor, and HDL-C>59 mg/dL is  a negative risk factor for CHD.  Ref: American Heart Association; Circulation 2004      LDL Calculated 08/12/2022 010 (H)  0 - 99 mg/dL Final    VLDL Calculated 08/12/2022 22  10 - 40 mg/dL Final    Cholesterol / HDL Ratio 08/12/2022 4.2  See Below Index Final    Comment: Chol/HDL Ratio:  Classification                   Male     Male  Very Low (1/2 Average Risk)      <3.4     <3.3  Low Risk                         4.0      3.8  Average Risk                     5.0      4.5  Moderate Risk (2X Average risk)  9.5      7.0  High Risk (3X Average Risk)      >23.0    >11.0      Hemoglobin A1C  08/12/2022 4.8  4.6 - 5.6 % Final    Comment: Test performed using Abbott Alinity enzymatic method.  Per ADA guidelines Hemoglobin A1c values of 5.7-6.4%  indicate an increased risk for developing diabetes mellitus.  Hemoglobin A1c values greater than or equal to 6.5% are  diagnostic of diabetes mellitus. A Triglyceride result  greater than 3000 mg/dL can falsely decrease UVO5D results.  Congenital hemoglobinopathy can falsely lower the Hemoglobin  A1c values - Boronate-affinity HbA1c is recommended.  Average Estimated Glucose 08/12/2022 91.1  mg/dL Final    Glucose 09/81/1914 95  70 - 100 mg/dL Final    Comment: ADA guidelines for diabetes mellitus:  Fasting:  Equal to or greater than 126 mg/dL  Random:   Equal to or greater than 200 mg/dL      BUN 78/29/5621 30.8  9.0 - 28.0 mg/dL Final    Creatinine 65/78/4696 0.9  0.5 - 1.5 mg/dL Final    Sodium 29/52/8413 141  135 - 145 mEq/L Final    Potassium 08/12/2022 5.0  3.5 - 5.3 mEq/L Final    Comment: Interpret with caution. Potassium may be falsely elevated due to the high  hemolysis index. Recollect if clinically indicated.      Chloride 08/12/2022 107  99 - 111 mEq/L Final    CO2 08/12/2022 25  17 - 29 mEq/L Final    Calcium 08/12/2022 9.4  8.5 - 10.5 mg/dL Final    Protein, Total 08/12/2022 8.0  6.0 - 8.3 g/dL Final    Albumin 24/40/1027 4.3  3.5 - 5.0 g/dL Final    AST (SGOT) 25/36/6440 30  5 - 41 U/L Final    ALT 08/12/2022 28  0 - 55 U/L Final    Alkaline Phosphatase 08/12/2022 61  37 - 117 U/L Final    Bilirubin, Total 08/12/2022 0.8  0.2 - 1.2 mg/dL Final    Globulin 34/74/2595 3.7 (H)  2.0 - 3.6 g/dL Final    Albumin/Globulin Ratio 08/12/2022 1.2  0.9 - 2.2 Final    Anion Gap 08/12/2022 9.0  5.0 - 15.0 Final    Comment: Calculated AGAP = Na - (CL + CO2)  Interpret with caution; calculated AGAP may not  reflect patient's true clinical status.  This is a calculated value and platform-dependent.  A value >12.0 has been recommended for the management  of  Hyperglycemic Crises: Diabetic Ketoacidosis and Hyperglycemic  Hyperosmolar State. Med Clin [place]. 2017;101(3):587-606.  doi:10.1016/j.mcna.2016.12.011      eGFR 08/12/2022 >60.0  >=60 mL/min/1.73 m2 Final    Comment: Reported eGFR is based on the CKD-EPI 2021 equation that  does not use a race coefficient. This equation is used for  all patients (both Black and non-Black), and old and new GFR  estimates may differ by more than 10%, particularly at higher  eGFRcr values and at younger ages. For eGFR of 45-59 mL/min/1.73 m2  with uACR <30 mg/g, please check NKF KDOQI and KDIGO guidelines:  https://www.kidney.org/professionals/kdoqi    GFR estimates are unreliable in patients with:  Rapidly changing kidney function or recent dialysis,  extreme age, body size or body composition(obesity,  severe malnutrition). Abnormal muscle mass (limb  amputation, muscle wasting). In these patients,  alternative determinations of GFR should be obtained.      WBC 08/12/2022 6.80  3.10 - 9.50 x10 3/uL Final    Hgb 08/12/2022 16.2  12.5 - 17.1 g/dL Final    Hematocrit 63/87/5643 48.6  37.6 - 49.6 % Final    Platelets 08/12/2022 287  142 - 346 x10 3/uL Final    RBC 08/12/2022 5.39  4.20 - 5.90 x10 6/uL Final    MCV 08/12/2022 90.2  78.0 - 96.0 fL Final    MCH 08/12/2022 30.1  25.1 - 33.5 pg Final    MCHC 08/12/2022 33.3  31.5 - 35.8 g/dL Final    RDW 32/95/1884 14  11 - 15 % Final    MPV 08/12/2022 10.5  8.9 - 12.5 fL Final  Nucleated RBC 08/12/2022 0.0  0.0 - 0.0 /100 WBC Final    Absolute NRBC 08/12/2022 0.00  0.00 - 0.00 x10 3/uL Final    Hemolysis Index 08/12/2022 33 (H)  0 - 24 Index Final    Comment: Hemolysis Index of >24 may falsely elevate the result of LDH and/or  Potassium, if ordered. Interpret with caution. Recollect sample if  clinically indicated.      Thyroglobulin Antibodies 08/12/2022 <1  <=1 IU/mL Final    Comment: Test Performed by Clerance Lav,  [facility],  [address]  [place], [place] [address]  Si Raider, M.D., Ph.D., Director of Laboratories  [phone], CLIA [id number]      Lithium Level 08/12/2022 0.426 (L)  1.000 - 1.200 mEq/L Final       ASSESSMENT:    Parry Po is a 26 y.o. male endorsing symptoms suggestive of going through depressive phase.  He has been diagnosed with bipolar disorder type I with previous hospitalization.  Currently he is on lithium 900 mg daily with reporting tremor as a side effects.  We decided to check lithium level again with advised to take lithium regularly before lithium level.  We decided to go for small dose of Latuda as a trial to help with bipolar depression.  We discussed safety concern and currently he has been reporting problem with depression, hopeless feeling, previous suicide attempt, family history of suicide attempt, side effect from medication are possible risk factors.  He reports good involvement at work place and currently at work place, open to try new medication, being in counseling are protective factors.    Diagnoses  Encounter Diagnoses   Name Primary?    Bipolar 1 disorder Yes    Complicated bereavement        Risk assessment:  -The patient does not appear to be at imminent risk of self-harm or violence, though any future actions are unpredictable.      TREATMENT PLAN:   Treatment options and alternatives reviewed with patient, along with detailed discussion of medication(s) and side effects, and they concur with following plan:     Goals:   Patient will take prescribed medication responsibly at times ordered by the physician.  Patient will report the effectiveness of medications and any side effects that develop.  Patient will report evidence of the degree of improvement in symptoms.  Patient will attend follow-up appointments as scheduled by the physician.    Problem(s): Bipolar disorder, bereavement   Goal: Improvement in symptoms as evidenced by PHQ-9 and self-report   Status Depression with hopeless  feeling   Plan: Continue lithium 900 mg daily.  Check lithium level in 7 to 10 days.  Start lurasidone 20 mg daily with food.  Continue counseling.        Psychotherapy: [facility] has existing psychotherapist     Risk/benefits/alternatives/side effects of medications and treatment modalities discussed. Patient gives informed consent.    Target Date: Anticipate achievement of above goal(s) within 90 days. Will re-assess as needed.    Discharge Criteria:    1. Individual's symptomology and level of functioning have improved such that psychopharmacological interventions with specialist are not required.  2. Individual demonstrates lack of motivation or commitment to participate in treatment.     DISPOSITION & FOLLOW UP:   Follow up: 2 weeks    Patient has number to [facility] 24/7 central access line, and is aware to call 911 or ER in case of psychiatric emergency.  Derril Franek Aletta Edouard, MD

## 2022-09-21 ENCOUNTER — Telehealth (INDEPENDENT_AMBULATORY_CARE_PROVIDER_SITE_OTHER): Payer: Commercial Managed Care - POS | Admitting: Clinical

## 2022-09-21 DIAGNOSIS — F319 Bipolar disorder, unspecified: Secondary | ICD-10-CM

## 2022-09-23 ENCOUNTER — Telehealth: Payer: Self-pay | Admitting: Family Medicine

## 2022-09-23 NOTE — Telephone Encounter (Signed)
Err

## 2022-09-24 NOTE — Progress Notes (Signed)
[facility]  Individual Therapy Note    Name:  Walter Villarreal   DOB: [birth year]  Medical Records: [id number]    Time in: 4:03PM   Time out:  4:46PM    Telemedicine: Yes    Duration of Session:  43 minutes    Goal(s) Addressed: Identification of cognitive distortions, Increased mindfulness Depression: acquiring relapse prevention skills and reducing negative automatic thoughts    Intervention: Cognitive-Behavioral Therapy strategies were implemented.  Psychodynamic therapy strategies were implemented.     NARRATIVE:     Patient's presenting problem: He continues to meet criteria for the Bipolar I, Current or Most Recent Unspecified diagnoses. He continues to experience symptoms of: Depressive Disorder: usually depressed, loss of interest/pleasure, appetite +/-, sleep +/-, fatigue, worthlessness/guilt, concentration, withdrawal, and death/suicidal ideation. and appears to be unchanged. He noted varying psychosocial stressors.    Pt reports no major changes in mood and anxiety since last session. States he continues to depressed everyday, anhedonia, and apathy. Discussed recent visit with his psychiatrist. Pt expressed feeling of frustration from not being heard and have not find a medication regimen that helps with his mood. Explored thoughts and emotions. Discussed self-compassion discussed strategies for pt to start practice self-compassion. Discussed and processed potential barriers.     Pt endorsed recurrent thoughts of death and dying but denies plan or intent. Pt does not appear to be a harm to himself or other at this time.     ASSESSMENT:     Completed Screening Measures:       09/21/2022     3:54 PM 09/20/2022     1:46 PM 09/08/2022     8:38 AM   PHQ2/PHQ9 SCORE    PHQ2 Score 6 4 6    PHQ9 Score 24 20 6          09/07/2022     3:34 PM   GAD-7 Scores   Feeling afraid as if something awful might happen 0   Being so restless that it is hard to sit still 0   Trouble relaxing 3   Worrying  too much about different things 2   Not being able to stop or control worrying 2   Feeling nervous, anxious or on edge 2   GAD-7 Total Score 10   Becoming easily annoyed or irritable 1       Current Symptoms:   Little interest or pleasure in doing things, Feeling down, depressed, or hopeless, Trouble falling, Staying asleep, Sleeping too much, Feeling tired or having little energy, Poor appetite/overeating , Feeling bad about yourself or that you are a failure or have let yourself or your family down, Trouble concentrating on things, such as reading the newspaper or watching television, Moving or speaking so slowly that other people could have noticed? , Being so fidgety or restless that you have been moving around a lot more than usual, and Thoughts that you would be better off dead or of hurting yourself in some way      Mental Status Exam:  General Appearance: neatly groomed, adequately nourished, and casually dressed  Behavior/Psychomotor: normal  and good eye contact  Mood: euthymic, sad, and irritable  Affect: congruent with mood  Speech: normal pitch and normal volume  Language: verbal expression is clear , has comprehensible ideas through verbal articulation, and ideas are conveyed and properly produced  Thought Form/Process: well organized, associations intact, and goal directed  Thought Content: absent of psychotic symptoms and absent of suicidal or homicidal ideation  Perception /Sensorium: clear, intact and makes sense of stimuli received  Judgment: good  Insight: good  Cognitive Functioning: cognition grossly intact, alert, oriented to person, oriented to place, oriented to time, oriented to situation, memory intact, and attentive    Safety Risks:    Suicidal ideation? Plan: No  Intent: No  Access: N/A  Safety plan in place: Yes  Rating (1=safe/10=unsafe): 1   Act on these thoughts? No   Homicidal ideation? No   Thoughts of harm to others? No   Thoughts of self injury? No   Substance abuse cravings? No      Risk factors include: Mixed affect episode (e.g. Bipolar), Perceived burden on family or others, and Family history of suicide  Protective factors include: Identifies reasons for living Responsibility to family or others; living with family, Supportive social network of family or friends, and Engaged in work or school    Patient was found to be at no significant risk for danger to self or danger to others and is safe for treatment in the outpatient level of care.     Response to intervention/skills and/or knowledge gained: Walter Villarreal appeared open to feedback and intervention.        TREATMENT PLAN:     Patient Stated Goals Addressed: Manage depression     [] Psychotherapy 1x weekly       [x] Psychotherapy 2x monthly   [] Psychotherapy 1x monthly  [] Plan to Discharge     []  No future appointments made by patient    Solution Focused reframing to elicit goal and future focused talk, Eliciting positive experiences and exceptions to problematic symptoms, CBT emotion identification tools, CBT cognitive coping tools , and Coaching and instructing in self care activities    He is applying the therapeutic interventions evidenced by: active and open discussion regarding skills used in session.    Progress Towards Goals: Stable/Improved; at goal    PLAN:     Follow up appointment: Date and Time:10/19/22  Patient aware to call 911 or ER in case of psychiatric emergency.  Continue psychotherapy with the patient and to emphasize the importance of implementing psychotherapeutic techniques at home and work.     Jorene Minorshristina H Ailee Pates, LCSW

## 2022-09-28 ENCOUNTER — Encounter: Payer: Self-pay | Admitting: Family Medicine

## 2022-10-15 ENCOUNTER — Telehealth (HOSPITAL_BASED_OUTPATIENT_CLINIC_OR_DEPARTMENT_OTHER): Payer: Commercial Managed Care - POS | Admitting: Psychiatry

## 2022-10-15 ENCOUNTER — Ambulatory Visit (HOSPITAL_BASED_OUTPATIENT_CLINIC_OR_DEPARTMENT_OTHER): Payer: Commercial Managed Care - POS | Admitting: Psychiatry

## 2022-10-15 DIAGNOSIS — F4322 Adjustment disorder with anxiety: Secondary | ICD-10-CM

## 2022-10-15 DIAGNOSIS — F314 Bipolar disorder, current episode depressed, severe, without psychotic features: Secondary | ICD-10-CM

## 2022-10-15 MED ORDER — CLONAZEPAM 0.5 MG PO TABS
0.5000 mg | ORAL_TABLET | Freq: Every evening | ORAL | 1 refills | Status: DC
Start: 2022-10-15 — End: 2022-11-04

## 2022-10-15 MED ORDER — LITHIUM CARBONATE ER 450 MG PO TBCR
450.0000 mg | EXTENDED_RELEASE_TABLET | Freq: Every evening | ORAL | 0 refills | Status: DC
Start: 2022-10-15 — End: 2022-11-04

## 2022-10-15 NOTE — Progress Notes (Unsigned)
[facility] Behavioral Health Psychiatric Evaluation    Date/Time:   10/15/2022  2:34 PM  Name:  Walter Villarreal, Walter Villarreal  MRN:  [id number]  Age:   26 y.o.  DOB:   [birth year]  SEX:   male    Verbal Informed Consent:  During COVID-19 pandemic, requirement for written consent has been waived. Provider will review the following documents verbally with patient prior to starting this evaluation.     Telemedicine:   Verbal consent has been obtained from the patient to conduct {PhoneorVideoConsent:47158}.  {YES:25569::"yes"}    Informed Consent for Psychotropic Medications:  Review of informed consent psychotropic medications form was completed verbally:   {YES:25569::"yes"}    Care Coordination:   Review of Care Coordination form was completed verbally:   {YES:25569::"yes"}  Identified primary care provider: ***  Other: ***     ADA Special Needs Assessment:  Review of ADA Special Needs Assessment form was completed verbally:   {YES:25569::"yes"}    Patient Rights and Responsibilities:  Review of patient rights and responsibilities form with patient was completed verbally:   {YES:25569::"yes"}    Authorization for Claims Payment and Review:  Review of Authorization for Claims Payment and Review form was completed verbally:   {YES:25569::"yes"}      CHIEF COMPLAINT:  ***    HISTORY OF PRESENT ILLNESS:   [name],  a 26 y.o. male with history of ***, presents for psychiatric intake evaluation. Patient was referred by {Referral:45384:::0}.    Patient was diagnosed with bipolar I d/o around age 89 or 68. Has sen different psychiatrists and therapists int he past. Lithium has helped in treating mania. However, still struggles with severe depression. This has been a longstanding issue. Saw Dr. Christoper Allegra once for a consult, but did not not feel for comfortable continuing care. Saw Lovena Neighbours before this. Reports that provider was throwing medications at him; only having side effects, getting worse, wanted to consult with someone  else. Had seen her for a couple of months. Had been getting lithium for his GP before then. Took Latuda 20mg  as prescribed by Dr. Christoper Allegra, however noted paranoia and increased anxiety. So he reduced this to 10mg . Taking lithium 900mg  nightly. Most recent lithium level was 0.42. Has not been on higher doses. Started on lithium when diagnosed initially around age 79 or 81. Does report side effects of shakes with lithium and headaches. Had second manic episode (hypomanic episode) so went off of it. Taking it consistently now. 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 Thought he had solved a problem and went down a rabbit hole of things that were not real. Was also drinking and partying.     During manic episode had 0 sleep but was very productive, pressured "felt happy", had grandiose delusions, parents gave a anonymous concern report.    Describes "thinking about killing himself almost all the time every day". Does not find enjoyment in most things. Do plan/intent to harm self. Does feel hopeless.Tends to "fake" a smile and social interactions. Little desire to do much. Has felt this was since he was 15. Energy is super lower. Drinks 1-3 cups of coffee a day. Hard to enjoy things. Convinces himself that he likes them. Can have occasionally anxiety. However, is mostly depression. No panic attacks. Disciplined in getting a good sleep. Had been seeing Christina for therapy. Talking about issues has not helped. Has worked with several therapists in the past. Has worked with a Paramedic for 2 years.     Describes paranoia  as increased anxiety. Paranoid that he had done things wrong and had messed things up. Would go down illogical spirals. This was new    Had a sleep study done about 1-2 years ago.     Currently taking lithium 300mg  CR tablets      ADDITIONAL PSYCHIATRIC REVIEW OF SYMPTOMS:   Psychosis Hallucinations - No evidence of hallucinosis  Delusions - Paranoia: as per HPI   Manic Symptoms No   Trauma/Other Relevant No    Suicidal Suicidal Thoughts? no   Suicidal Plan?  no   Homicidal Ideations no   Safety Access to Guns? Denies access     PAST PSYCHIATRIC HISTORY:    Diagnoses:   Bipolar d/o, ADHD?   Outpatient Providers: As per HPI    Medication History: Per records and patient report, "previously tried Lamictal that was nauseous, Seroquel was causing drowsiness and feeling zombie, Wellbutrin, Vyvanse causing jitteriness with no benefit, Abilify at different time"   Hospitalizations:        2019 in [place], in ED for hypomanic episode in 2021  Suicide Attempts Yes: tried to overdose on Lithium after hospitalization for mania   Self Injury No   Suicide Exposure Yes: sister committed suicide   Violence to Others No   Head Injuries/Seizures 8 concussions for ice hockey and lacrosse growing up     SUBSTANCE ABUSE HISTORY:     Alcohol Moderate, 3-4 drinks, 2-3 times a week   Cannabis Started smoking recently for sleep, smoked 2-3 times a week age 5   Cocaine No   Heroin No   Sedatives No   Other No   Tobacco Social History     Tobacco Use   Smoking Status Never   Smokeless Tobacco Never      Treatments None     MEDICAL HISTORY:     Past Medical History:   Diagnosis Date    Bipolar 1 disorder      Past Surgical History:   Procedure Laterality Date    TONSILLECTOMY, ADENOIDECTOMY       Allergies   Allergen Reactions    Other Environmental Other (See Comments)     Grasses and tree pollen     No outpatient medications have been marked as taking for the 10/15/22 encounter (Appointment) with Ervin Knack, MD.       FAMILY HISTORY:     Family History   Problem Relation Age of Onset    Hypertension Mother     Scoliosis Mother     Other Father         Knee and vertebrae issues    Hypertension Father     Sleep apnea Father     Suicidality Sister     ADD / ADHD Sister     Depression Sister     Bipolar disorder Maternal Grandfather     Bipolar disorder Paternal Grandfather     Bipolar disorder Paternal Uncle        SOCIAL HISTORY:    Childhood/Developmental/Education: Born in [place], [place] up in [place] to [place]. Living in [place]. Parents live in the area, but may move to [place] in a couple of years.  Fairly close with family. 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 Has been there 1 year. Hybrid mix  Legal History: No    PSYCHIATRIC SPECIALTY & MENTAL STATUS EXAMINATION:     Vital Signs  There were no  vitals taken for this visit.   General Appearance  Neatly groomed, appropriately dressed and adequately nourished   Behavior/Movements  Appropriate/Cooperative   Speech/Language  Normal Rate, Rhythm, Volume, and Tone; Appropriate Language Use   Thought Process  Logical, Linear, Goal Directed   Thought Content  No evidence of homicidal, suicidal, violent, or delusional thought content   Perceptions  No evidence of hallucinosis   Insight/Judgment   Good   Cognition  Sensorium clear, adequate fund of knowledge   Mood  Depressed   Affect  {NMJAFFECT:29627}     SCREENS/LABS/IMAGING:   PHQ-9:  Little interest or pleasure in doing things: 3 (10/15/2022  2:13 PM)  Feeling down, depressed, or hopeless: 3 (10/15/2022  2:13 PM)  Trouble falling or staying asleep, or sleeping too much: 2 (10/15/2022  2:13 PM)  Feeling tired or having little energy: 3 (10/15/2022  2:13 PM)  Poor appetite or overeating: 3 (10/15/2022  2:13 PM)  Feeling bad about yourself - or that you are a failure or have let yourself or your family down: 3 (10/15/2022  2:13 PM)  Trouble concentrating on things, such as reading the newspaper or watching television: 2 (10/15/2022  2:13 PM)  Moving or speaking so slowly that other people could have noticed. Or the opposite - being so fidgety or restless that you have been moving around a lot more than usual: 1 (10/15/2022  2:13 PM)  Thoughts that you would be better off dead, or of hurting yourself in some way: 3 (10/15/2022  2:13 PM)  PHQ Total  Score: (!) 23 (10/15/2022  2:13 PM)      Please Note: The PHQ-9 is a screening tool to aid in diagnosis and tracking of depressive disorders, and is not validated to independently determine level of risk.    Office Visit on 09/08/2022   Component Date Value Ref Range Status    Intrinsic Factor Blocking Antibody 09/08/2022 Negative  Negative Final    Comment:   For additional information, please refer to  http://education.[facility].com/faq/IFAB  (This link is being provided for informational/   educational purposes only.)    Test Performed by [facility], [place],  [facility],  [address], [place], [place] [id number]  Si RaiderPatrick W Mason, M.D., Ph.D., Director of Laboratories  [phone], CLIA [id number]      TSH, Abn Reflex to Free T4, Serum 09/08/2022 1.79  0.35 - 4.94 uIU/mL Final   Clinical Support on 08/12/2022   Component Date Value Ref Range Status    Urine Type 08/12/2022 Clean Catch   Final    Color, UA 08/12/2022 Yellow   Final    Clarity, UA 08/12/2022 Turbid (A)  Clear - Hazy Final    Specific Gravity UA 08/12/2022 1.021  1.001 - 1.035 Final    Urine pH 08/12/2022 5.5  5.0 - 8.0 Final    Leukocyte Esterase, UA 08/12/2022 Negative  Negative Final    Nitrite, UA 08/12/2022 Negative  Negative Final    Protein, UR 08/12/2022 10= Trace (A)  Negative Final    Glucose, UA 08/12/2022 Negative  Negative Final    Ketones UA 08/12/2022 Negative  Negative Final    Urobilinogen, UA 08/12/2022 Normal  mg/dL Final    Bilirubin, UA 08/12/2022 Negative  Negative Final    Blood, UA 08/12/2022 Negative  Negative Final    RBC, UA 08/12/2022 3 - 5  0 - 5 /hpf Final    Comment: 0-5 RBC/hpf may be regarded as normal in some patient  populations,e.g. menstruating women.Some specialty practices  may define 3-5 RBC/hpf as microscopic hematuria.      WBC, UA 08/12/2022 0-5  0 - 5 /hpf Final    Squamous Epithelial Cells, Urine 08/12/2022 0-5  0 - 25 /hpf Final    Urine Mucus 08/12/2022 Present  None  Final    Vitamin D, 25 OH, Total 08/12/2022 22 (L)  30 - 100 ng/mL Final    Comment: Vitamin D, 25-OH, Total: Testing performed using Abbott  Alinity chemiluminescent Microparticle immunoassay methodology.  Method dependent minor difference may exist based on the test  platform used.  Vitamin D levels of <20 ng/ml are considered deficiency.  Vitamin D levels of 20-30 ng/ml suggest insufficiency.  Optimal Vitamin D levels are > or =30 ng/ml.      Vitamin B-12 08/12/2022 276  211 - 911 pg/mL Final    Cholesterol 08/12/2022 212 (H)  0 - 199 mg/dL Final    Triglycerides 08/12/2022 109  34 - 149 mg/dL Final    HDL 16/09/9603 51  40 - 9,999 mg/dL Final    Comment: An HDL cholesterol <40 mg/dL is low and constitutes a  coronary heart disease risk factor, and HDL-C>59 mg/dL is  a negative risk factor for CHD.  Ref: American Heart Association; Circulation 2004      LDL Calculated 08/12/2022 540 (H)  0 - 99 mg/dL Final    VLDL Calculated 08/12/2022 22  10 - 40 mg/dL Final    Cholesterol / HDL Ratio 08/12/2022 4.2  See Below Index Final    Comment: Chol/HDL Ratio:  Classification                   Male     Male  Very Low (1/2 Average Risk)      <3.4     <3.3  Low Risk                         4.0      3.8  Average Risk                     5.0      4.5  Moderate Risk (2X Average risk)  9.5      7.0  High Risk (3X Average Risk)      >23.0    >11.0      Hemoglobin A1C 08/12/2022 4.8  4.6 - 5.6 % Final    Comment: Test performed using Abbott Alinity enzymatic method.  Per ADA guidelines Hemoglobin A1c values of 5.7-6.4%  indicate an increased risk for developing diabetes mellitus.  Hemoglobin A1c values greater than or equal to 6.5% are  diagnostic of diabetes mellitus. A Triglyceride result  greater than 3000 mg/dL can falsely decrease JWJ1B results.  Congenital hemoglobinopathy can falsely lower the Hemoglobin  A1c values - Boronate-affinity HbA1c is recommended.      Average Estimated Glucose 08/12/2022 91.1  mg/dL Final     Glucose 14/78/2956 95  70 - 100 mg/dL Final    Comment: ADA guidelines for diabetes mellitus:  Fasting:  Equal to or greater than 126 mg/dL  Random:   Equal to or greater than 200 mg/dL      BUN 21/30/8657 84.6  9.0 - 28.0 mg/dL Final    Creatinine 96/29/5284 0.9  0.5 - 1.5 mg/dL Final    Sodium 13/24/4010 141  135 - 145 mEq/L Final    Potassium 08/12/2022 5.0  3.5 -  5.3 mEq/L Final    Comment: Interpret with caution. Potassium may be falsely elevated due to the high  hemolysis index. Recollect if clinically indicated.      Chloride 08/12/2022 107  99 - 111 mEq/L Final    CO2 08/12/2022 25  17 - 29 mEq/L Final    Calcium 08/12/2022 9.4  8.5 - 10.5 mg/dL Final    Protein, Total 08/12/2022 8.0  6.0 - 8.3 g/dL Final    Albumin 40/98/1191 4.3  3.5 - 5.0 g/dL Final    AST (SGOT) 47/82/9562 30  5 - 41 U/L Final    ALT 08/12/2022 28  0 - 55 U/L Final    Alkaline Phosphatase 08/12/2022 61  37 - 117 U/L Final    Bilirubin, Total 08/12/2022 0.8  0.2 - 1.2 mg/dL Final    Globulin 13/07/6577 3.7 (H)  2.0 - 3.6 g/dL Final    Albumin/Globulin Ratio 08/12/2022 1.2  0.9 - 2.2 Final    Anion Gap 08/12/2022 9.0  5.0 - 15.0 Final    Comment: Calculated AGAP = Na - (CL + CO2)  Interpret with caution; calculated AGAP may not  reflect patient's true clinical status.  This is a calculated value and platform-dependent.  A value >12.0 has been recommended for the management of  Hyperglycemic Crises: Diabetic Ketoacidosis and Hyperglycemic  Hyperosmolar State. Med Clin [place]. 2017;101(3):587-606.  doi:10.1016/j.mcna.2016.12.011      eGFR 08/12/2022 >60.0  >=60 mL/min/1.73 m2 Final    Comment: Reported eGFR is based on the CKD-EPI 2021 equation that  does not use a race coefficient. This equation is used for  all patients (both Black and non-Black), and old and new GFR  estimates may differ by more than 10%, particularly at higher  eGFRcr values and at younger ages. For eGFR of 45-59 mL/min/1.73 m2  with uACR <30 mg/g, please check NKF  KDOQI and KDIGO guidelines:  https://www.kidney.org/professionals/kdoqi    GFR estimates are unreliable in patients with:  Rapidly changing kidney function or recent dialysis,  extreme age, body size or body composition(obesity,  severe malnutrition). Abnormal muscle mass (limb  amputation, muscle wasting). In these patients,  alternative determinations of GFR should be obtained.      WBC 08/12/2022 6.80  3.10 - 9.50 x10 3/uL Final    Hgb 08/12/2022 16.2  12.5 - 17.1 g/dL Final    Hematocrit 46/96/2952 48.6  37.6 - 49.6 % Final    Platelets 08/12/2022 287  142 - 346 x10 3/uL Final    RBC 08/12/2022 5.39  4.20 - 5.90 x10 6/uL Final    MCV 08/12/2022 90.2  78.0 - 96.0 fL Final    MCH 08/12/2022 30.1  25.1 - 33.5 pg Final    MCHC 08/12/2022 33.3  31.5 - 35.8 g/dL Final    RDW 84/13/2440 14  11 - 15 % Final    MPV 08/12/2022 10.5  8.9 - 12.5 fL Final    Nucleated RBC 08/12/2022 0.0  0.0 - 0.0 /100 WBC Final    Absolute NRBC 08/12/2022 0.00  0.00 - 0.00 x10 3/uL Final    Hemolysis Index 08/12/2022 33 (H)  0 - 24 Index Final    Comment: Hemolysis Index of >24 may falsely elevate the result of LDH and/or  Potassium, if ordered. Interpret with caution. Recollect sample if  clinically indicated.      Thyroglobulin Antibodies 08/12/2022 <1  <=1 IU/mL Final    Comment: Test Performed by [facility],  [facility],  [address] [place], [place] [address]  Si Raider, M.D., Ph.D., Director of Laboratories  [phone], CLIA [id number]      Lithium Level 08/12/2022 0.426 (L)  1.000 - 1.200 mEq/L Final       ASSESSMENT:    Rubert Frediani is a 26 y.o. male ***    Diagnoses  No diagnosis found.    Risk assessment:  -The patient does not appear to be at imminent risk of self-harm or violence, though any future actions are unpredictable.      TREATMENT PLAN:   Treatment options and alternatives reviewed with patient, along with detailed discussion of medication(s) and side effects, and they  concur with following plan:     Goals:   Patient will take prescribed medication responsibly at times ordered by the physician.  Patient will report the effectiveness of medications and any side effects that develop.  Patient will report evidence of the degree of improvement in symptoms.  Patient will attend follow-up appointments as scheduled by the physician.    Problem(s): ***   Goal: {BHGOAL:46198::"Maintain current symptom level"} as evidenced by {BHGoal2:46218}   Status {MMPROGRESS:46183::"Stable/Improved; at goal"}   Plan: Continue Lithium 900mg  daily        Problem(s): ***   Goal: {BHGOAL:46198::"Maintain current symptom level"} as evidenced by {BHGoal2:46218}   Status {MMPROGRESS:46183::"Stable/Improved; at goal"}   Plan: ***     Psychotherapy: {Therapy Referral:45833}     Risk/benefits/alternatives/side effects of medications and treatment modalities discussed. Patient gives informed consent.    Target Date: Anticipate achievement of above goal(s) within 90 days. Will re-assess as needed.    Discharge Criteria:    1. Individual's symptomology and level of functioning have improved such that psychopharmacological interventions with specialist are not required.  2. Individual demonstrates lack of motivation or commitment to participate in treatment.     DISPOSITION & FOLLOW UP:   Follow up: ***    Patient has number to [facility] 24/7 central access line, and is aware to call 911 or ER in case of psychiatric emergency.    , MD

## 2022-10-19 ENCOUNTER — Telehealth (INDEPENDENT_AMBULATORY_CARE_PROVIDER_SITE_OTHER): Payer: Commercial Managed Care - POS | Admitting: Clinical

## 2022-10-19 DIAGNOSIS — F319 Bipolar disorder, unspecified: Secondary | ICD-10-CM

## 2022-10-22 ENCOUNTER — Encounter (INDEPENDENT_AMBULATORY_CARE_PROVIDER_SITE_OTHER): Payer: Self-pay

## 2022-10-22 ENCOUNTER — Encounter (HOSPITAL_BASED_OUTPATIENT_CLINIC_OR_DEPARTMENT_OTHER): Payer: Self-pay | Admitting: Psychiatry

## 2022-10-22 NOTE — Progress Notes (Signed)
[facility]  Individual Therapy Note    Name:  Walter Villarreal   DOB: April 14, 1996  Medical Records: [id number]    Time in: 4:00PM   Time out:  4:45PM    Telemedicine: Yes    Duration of Session:  45 minutes    Goal(s) Addressed: Identification of cognitive distortions, Increased mindfulness Depression: acquiring relapse prevention skills, increasing interest in usual activities, and reducing negative automatic thoughts    Intervention: Cognitive-Behavioral Therapy strategies were implemented.  Psychodynamic therapy strategies were implemented.     NARRATIVE:     Patient's presenting problem: He continues to meet criteria for the Bipolar I, Current or Most Recent Unspecified diagnoses. He continues to experience symptoms of: Depressive Disorder: usually depressed, loss of interest/pleasure, appetite +/-, sleep +/-, fatigue, worthlessness/guilt, withdrawal, and death/suicidal ideation. and appears to be unchanged. He noted varying psychosocial stressors.     Pt reports minimal changes in mood. Continues to endorse feeling of depression, recurrent suicidal ideations, and apathy. Pt denies plan or intent of suicide. Pt does not appear to be a threat to himself or others at this time. Discussed his recent visit with new psychiatrist. Pt ambivalence about changes though continues to put toward effort in staying compliance with medical advice. Continue to explore and discuss role of negative self talk and unhelpful thoughts patterns that exacerbates sx of depression.      ASSESSMENT:     Completed Screening Measures:       10/19/2022     3:34 PM 10/15/2022     2:13 PM 09/21/2022     3:54 PM   PHQ2/PHQ9 SCORE    PHQ2 Score 6 6 6    PHQ9 Score 22 23 24          09/07/2022     3:34 PM   GAD-7 Scores   Feeling afraid as if something awful might happen 0   Being so restless that it is hard to sit still 0   Trouble relaxing 3   Worrying too much about different things 2   Not being able to stop or control  worrying 2   Feeling nervous, anxious or on edge 2   GAD-7 Total Score 10   Becoming easily annoyed or irritable 1       Current Symptoms:   Little interest or pleasure in doing things, Feeling down, depressed, or hopeless, Trouble falling, Staying asleep, Feeling tired or having little energy, Poor appetite/overeating , Feeling bad about yourself or that you are a failure or have let yourself or your family down, Trouble concentrating on things, such as reading the newspaper or watching television, Moving or speaking so slowly that other people could have noticed? , Being so fidgety or restless that you have been moving around a lot more than usual, and Thoughts that you would be better off dead or of hurting yourself in some way      Mental Status Exam:  General Appearance: neatly groomed, adequately nourished, and casually dressed  Behavior/Psychomotor: normal  and good eye contact  Mood: euthymic  Affect: congruent with mood  Speech: normal pitch and normal volume  Language: verbal expression is clear , has comprehensible ideas through verbal articulation, and ideas are conveyed and properly produced  Thought Form/Process: well organized, associations intact, and goal directed  Thought Content: absent of psychotic symptoms and absent of suicidal or homicidal ideation  Perception /Sensorium: clear, intact and makes sense of stimuli received  Judgment: fair  Insight: good  Cognitive  Functioning: cognition grossly intact, alert, oriented to person, oriented to place, oriented to time, oriented to situation, memory intact, and attentive    Safety Risks:    Suicidal ideation? No   Homicidal ideation? No   Thoughts of harm to others? No   Thoughts of self injury? No   Substance abuse cravings? No     Risk factors include: Major depressive episode, Perceived burden on family or others, and Family history of suicide  Protective factors include: Identifies reasons for living Responsibility to family or others; living  with family, Supportive social network of family or friends, and Engaged in work or school    Patient was found to be at no significant risk for danger to self or danger to others and is safe for treatment in the outpatient level of care.     Response to intervention/skills and/or knowledge gained: [name] appeared open to feedback and intervention.        TREATMENT PLAN:     Patient Stated Goals Addressed: Improve depression    [] Psychotherapy 1x weekly       [x] Psychotherapy 2x monthly   [] Psychotherapy 1x monthly  [] Plan to Discharge     []  No future appointments made by patient    Solution Focused reframing to elicit goal and future focused talk, CBT emotion identification tools, and CBT cognitive coping tools     He is applying the therapeutic interventions evidenced by: active and open discussion regarding skills used in session.    Progress Towards Goals: Stable/Improved; at goal    PLAN:     Follow up appointment: Date and Time:11/04/22  Patient aware to call 911 or ER in case of psychiatric emergency.  Continue psychotherapy with the patient and to emphasize the importance of implementing psychotherapeutic techniques at home and work.     Jorene Minors, LCSW

## 2022-10-25 ENCOUNTER — Telehealth (INDEPENDENT_AMBULATORY_CARE_PROVIDER_SITE_OTHER): Payer: Commercial Managed Care - POS | Admitting: Psychiatry

## 2022-10-27 ENCOUNTER — Ambulatory Visit (FREE_STANDING_LABORATORY_FACILITY): Payer: Commercial Managed Care - POS

## 2022-10-27 DIAGNOSIS — F319 Bipolar disorder, unspecified: Secondary | ICD-10-CM

## 2022-10-27 LAB — LITHIUM LEVEL: Lithium Level: 0.849 mEq/L — ABNORMAL LOW (ref 1.000–1.200)

## 2022-10-27 NOTE — Progress Notes (Signed)
Blood drawn using aseptic technique from right antecubital without complication on 1st attempt. Patient tolerated procedure and left in stable condition. Labs sent to [facility].

## 2022-11-02 ENCOUNTER — Encounter (INDEPENDENT_AMBULATORY_CARE_PROVIDER_SITE_OTHER): Payer: Self-pay | Admitting: Clinical

## 2022-11-04 ENCOUNTER — Ambulatory Visit (HOSPITAL_BASED_OUTPATIENT_CLINIC_OR_DEPARTMENT_OTHER): Payer: Commercial Managed Care - POS | Admitting: Psychiatry

## 2022-11-04 ENCOUNTER — Encounter (HOSPITAL_BASED_OUTPATIENT_CLINIC_OR_DEPARTMENT_OTHER): Payer: Self-pay

## 2022-11-04 ENCOUNTER — Telehealth (INDEPENDENT_AMBULATORY_CARE_PROVIDER_SITE_OTHER): Payer: Commercial Managed Care - POS | Admitting: Clinical

## 2022-11-04 VITALS — BP 150/90 | HR 91 | Ht 70.5 in

## 2022-11-04 DIAGNOSIS — F314 Bipolar disorder, current episode depressed, severe, without psychotic features: Secondary | ICD-10-CM

## 2022-11-04 DIAGNOSIS — F4322 Adjustment disorder with anxiety: Secondary | ICD-10-CM

## 2022-11-04 MED ORDER — LITHIUM CARBONATE 300 MG PO CAPS
900.0000 mg | ORAL_CAPSULE | Freq: Every day | ORAL | 0 refills | Status: DC
Start: 2022-11-04 — End: 2022-12-02

## 2022-11-04 MED ORDER — LITHIUM CARBONATE ER 450 MG PO TBCR
450.0000 mg | EXTENDED_RELEASE_TABLET | Freq: Every evening | ORAL | 0 refills | Status: DC
Start: 2022-11-04 — End: 2022-12-02

## 2022-11-04 MED ORDER — CAPLYTA 42 MG PO CAPS
42.0000 mg | ORAL_CAPSULE | Freq: Every day | ORAL | 0 refills | Status: DC
Start: 2022-11-04 — End: 2022-12-02

## 2022-11-04 NOTE — Progress Notes (Signed)
[facility] Behavioral Health Psychiatric Follow-Up    Date/Time:   11/04/2022  3:37 PM  Name:  Walter Villarreal, Walter Villarreal  MRN:   [id number]  Age:   26 y.o.  DOB:   02/07/1996  SEX:   male    Verbal consent has been obtained from the patient to conduct a video visit encounter.    CHIEF COMPLAINT:  depression, anxiety    HISTORY OF PRESENT ILLNESS:   Walter Villarreal, a 26 y.o. male with history of bipolar I d/o, presents for follow-up appointment.     Patient reports trying clonazepam at night. Was not able to think. Describes waking up "super late". Patient reports that lithium was increased. Notes more shakes . Has been meeting with Trula Ore for therapy. Patient describes having suicidal thoughts all day. No current plan/intent to harm self, though reports passive SI continue to be chronic. Notes frustration with symptoms of depression not improving despite multiple medication and treatment trials. He continues to struggle with severe depression. Also reports feeling more anxious. Notes that clinic setup is triggering for him in relation to past trauma from mental health treatment and hospitalization.     Medication Compliance/Substance Use: appropriate as assessed    Outpatient Medications Marked as Taking for the 11/04/22 encounter (Office Visit) with Ervin Knack, MD   Medication Sig Dispense Refill    Docosahexaenoic Acid (Algal Omega-3 DHA) 200 MG Cap Take by mouth      lithium (LITHOBID) 450 MG CR tablet Take 1 tablet (450 mg) by mouth nightly with 300mg  tablets (1050mg  total nightly) 30 tablet 0    lithium 300 MG capsule Take 3 capsules (900 mg) by mouth daily 90 capsule 0    vitamin D (CHOLECALCIFEROL) 25 MCG (1000 UT) tablet Take 1 tablet (1,000 Units) by mouth daily         PSYCHIATRIC SPECIALTY & MENTAL STATUS EXAMINATION:     Vital Signs  BP 150/90 (BP Site: Left arm, Patient Position: Sitting, Cuff Size: Large)   Pulse 91   Ht 1.791 m (5' 10.5")   SpO2 96%   BMI 38.19 kg/m    General Appearance   Neatly groomed, appropriately dressed and adequately nourished   Behavior/Movements  Appropriate/Cooperative and mild hand tremor   Speech/Language  Normal Rate, Rhythm, Volume, and Tone; Appropriate Language Use   Thought Process  Logical, Linear, Goal Directed   Thought Content   Chronic passive SI; no plan/intent   Perceptions  No evidence of hallucinosis   Insight/Judgment   Good   Cognition  Sensorium clear, adequate fund of knowledge   Mood  Depressed, Anxious   Affect  Anxious, Limited / Constricted, Frustrated     SCREENS/LABS/IMAGING:   PHQ-9  No data recorded    Please Note: The PHQ-9 is a screening tool to aid in diagnosis and tracking of depressive disorders, and is not validated to independently determine level of risk.     ASSESSMENT    Walter Villarreal is a 26 y.o. male with past psychiatric history of bipolar disorder presents with longstanding history of depression, refractory to prior therapy and treatment. Patient describes two distinct prio manic episodes. He has been maintained on lithium which has helped in preventing further episodes. However, he continues to suffer from predominant depressive symptom which has impacted his enjoyment in life and has led to chronic passive death wish and feelings of hopelessness. He is interested in pursing further options to better target symptoms. He has good support system in place,  and remains future oriented. Most recently, reported adverse effect to clonazepam, so this medication was discontinued.    Diagnoses  Encounter Diagnoses   Name Primary?    Bipolar 1 disorder, depressed, severe Yes    Adjustment disorder with anxiety      Risk assessment:  -The patient does not appear to be at imminent risk of self-harm or violence, though any future actions are unpredictable.      TREATMENT PLAN:   Treatment options and alternatives reviewed with patient, along with detailed discussion of medication(s) and side effects, and they concur with following plan:      Goals:   Patient will take prescribed medication responsibly at times ordered by the physician.  Patient will report the effectiveness of medications and any side effects that develop.  Patient will report evidence of the degree of improvement in symptoms.  Patient will attend follow-up appointments as scheduled by the physician.    Problem(s): Bipolar Depression, Anxiety   Goal: Improvement in symptoms as evidenced by PHQ-9 and self-report   Status Poorly controlled/worse   Plan: Continue lithium 1050mg  daily. Most recent level 0.85. Discussed option of reducing this at a later date given tremor  Discontinued clonazepam due to side effects  Start Caplyta 42mg  for bipolar depression.  Discussed genesight testing. Will provide additional information to patient  Reviewed various other treatment modalities with patient including ECT. Reviewed pros and cons of treatment including potential process of facilitating this either through inpatient admission or outpatient referral  Patient to provide prior sleep study results    Reviewed safety planning      Risk/benefits/alternatives/side effects of medications and treatment modalities discussed. Patient gives informed consent.    Target Date: Anticipate achievement of above goal(s) within 90 days. Will re-assess as needed.    Discharge Criteria:    1. Individual's symptomology and level of functioning have improved such that psychopharmacological interventions with specialist are not required.  2. Individual demonstrates lack of motivation or commitment to participate in treatment.     THERAPY:   N/A    DISPOSITION & FOLLOW UP:   Follow up: 3-4 weeks    Patient has number to [place]'s 24/7 central access line, and is aware to call 911 or ER in case of psychiatric emergency.    50 minutes spent providing direct care and coordination with patient including evaluation of current symptoms, discussion of modalities for workup including review of sleep study and genetic testing  ,as well as treatment options such as ECT.    Ervin Knack, MD

## 2022-11-04 NOTE — Progress Notes (Signed)
Met with patient to obtain vitals. Verified name and date of birth. Write informed provider of high BP and HR. Pt said they numbers may be high because he "really does not like being in a hospital setting." Writer suggested taking BP again, but patient did not want to.

## 2022-11-04 NOTE — Progress Notes (Signed)
Writer called pharmacy per provider's request to check on Lumateperone Tosylate (Caplyta) 42 MG Cap , VM left for pharmacist.

## 2022-11-05 ENCOUNTER — Encounter (HOSPITAL_BASED_OUTPATIENT_CLINIC_OR_DEPARTMENT_OTHER): Payer: Self-pay

## 2022-11-05 NOTE — Progress Notes (Signed)
Writer called pharmacy again regarding lumateperone Tosylate (Caplyta) 42 MG Cap , and option to speak to the pharmacy is automatically hanging callers up. Writer chose VM option and left a detailed message for the pharmacist with a call back info.

## 2022-11-07 ENCOUNTER — Encounter (HOSPITAL_BASED_OUTPATIENT_CLINIC_OR_DEPARTMENT_OTHER): Payer: Self-pay | Admitting: Psychiatry

## 2022-11-08 ENCOUNTER — Telehealth (INDEPENDENT_AMBULATORY_CARE_PROVIDER_SITE_OTHER): Payer: Commercial Managed Care - POS | Admitting: Clinical

## 2022-11-08 ENCOUNTER — Telehealth (HOSPITAL_BASED_OUTPATIENT_CLINIC_OR_DEPARTMENT_OTHER): Payer: Self-pay

## 2022-11-08 ENCOUNTER — Encounter (INDEPENDENT_AMBULATORY_CARE_PROVIDER_SITE_OTHER): Payer: Self-pay | Admitting: Clinical

## 2022-11-08 NOTE — Telephone Encounter (Signed)
Writer called patient per provider's request regarding his chart message. 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 He states he would like to continue with the med for couple of nights and see how he feel. Writer provided him the information to reach [place] if he is not able to tolerate the med or if symptoms persists.

## 2022-11-09 ENCOUNTER — Telehealth (INDEPENDENT_AMBULATORY_CARE_PROVIDER_SITE_OTHER): Payer: Commercial Managed Care - POS | Admitting: Clinical

## 2022-11-09 DIAGNOSIS — F319 Bipolar disorder, unspecified: Secondary | ICD-10-CM

## 2022-11-09 NOTE — Progress Notes (Unsigned)
Behavioral Health Services Integrated  Individual Therapy Note    Name:  Walter Villarreal   DOB: 05/05/1996  Medical Records: [id number]    Time in: 4:33PM   Time out:  5:35PM    Telemedicine: Yes    Duration of Session:  62 minutes    Goal(s) Addressed: Identification of cognitive distortions, Increased mindfulness Depression: acquiring relapse prevention skills, increasing interest in usual activities, and reducing negative automatic thoughts    Intervention: Cognitive-Behavioral Therapy strategies were implemented.  Psychodynamic therapy strategies were implemented.     NARRATIVE:     Patient's presenting problem: He continues to meet criteria for the Unspecified Bipolar and Related Disorder diagnoses. He continues to experience symptoms of: Depressive Disorder: usually depressed, loss of interest/pleasure, appetite +/-, sleep +/-, fatigue, worthlessness/guilt, withdrawal, and death/suicidal ideation. and appears to be unchanged. He noted varying psychosocial stressors including side effects from new medications.    Pt reports not doing well physically for the last few days due to new medication, Caplyta, he has been taking. Pt endorsed fatigue, dizziness, unable to walk due to dizziness (fell down recently. Pt shares he was advised to take it for 5 more days as those are expected side effects of meds. Pt shares he wants to continue taking meds, despite not feeling well on it. States he doesn't want to be perceived as non-compliance. LCSW recommended that pt continues to stay in touch with psychiatrist if side effects get worst. Discussed plans to ensure safety in current condition. Pt shares his parents have been checking on him and girlfriend will come over later to help look after him. Processed pt's frustration with ongoing lack of progress with psychiatry. LCSW validated pt's thoughts and frustrations.        ASSESSMENT:     Completed Screening Measures:       10/19/2022     3:34 PM 10/15/2022     2:13 PM  09/21/2022     3:54 PM   PHQ2/PHQ9 SCORE    PHQ2 Score 6 6 6    PHQ9 Score 22 23 24          09/07/2022     3:34 PM   GAD-7 Scores   Feeling afraid as if something awful might happen 0   Being so restless that it is hard to sit still 0   Trouble relaxing 3   Worrying too much about different things 2   Not being able to stop or control worrying 2   Feeling nervous, anxious or on edge 2   GAD-7 Total Score 10   Becoming easily annoyed or irritable 1       Current Symptoms:   Little interest or pleasure in doing things, Feeling down, depressed, or hopeless, Trouble falling, Staying asleep, Sleeping too much, Feeling tired or having little energy, Poor appetite/overeating , Feeling bad about yourself or that you are a failure or have let yourself or your family down, Trouble concentrating on things, such as reading the newspaper or watching television, Moving or speaking so slowly that other people could have noticed? , Being so fidgety or restless that you have been moving around a lot more than usual, and Thoughts that you would be better off dead or of hurting yourself in some way        Mental Status Exam:  General Appearance: neatly groomed, adequately nourished, and casually dressed  Behavior/Psychomotor: normal   Mood: euthymic and other: exhausted  Affect: congruent with mood  Speech: normal pitch and normal  volume  Language: verbal expression is clear , has comprehensible ideas through verbal articulation, and ideas are conveyed and properly produced  Thought Form/Process: well organized, associations intact, and goal directed  Thought Content: absent of psychotic symptoms and absent of suicidal or homicidal ideation  Perception /Sensorium: clear, intact and makes sense of stimuli received  Judgment: good  Insight: good  Cognitive Functioning: cognition grossly intact, alert, oriented to person, oriented to place, oriented to time, oriented to situation, memory intact, and attentive    Safety Risks:    Suicidal  ideation? No   Homicidal ideation? No   Thoughts of harm to others? No   Thoughts of self injury? No   Substance abuse cravings? No     Risk factors include: Major depressive episode and Mixed affect episode (e.g. Bipolar)  Protective factors include: Identifies reasons for living Responsibility to family or others; living with family and Supportive social network of family or friends    Patient was found to be at no significant risk for danger to self or danger to others and is safe for treatment in the outpatient level of care.     Response to intervention/skills and/or knowledge gained: Vere appeared open to feedback and intervention.        TREATMENT PLAN:     Patient Stated Goals Addressed: Manage depression    [] Psychotherapy 1x weekly       [x] Psychotherapy 2x monthly   [] Psychotherapy 1x monthly  [] Plan to Discharge     []  No future appointments made by patient    Solution Focused reframing to elicit goal and future focused talk, Encouragement to maintain current positive and effective coping tools and strategies, CBT emotion identification tools, and CBT cognitive coping tools     He is applying the therapeutic interventions evidenced by: active and open discussion regarding skills used in session.    Progress Towards Goals: Stable/Improved; at goal    PLAN:     Follow up appointment: Date and Time:11/17/22  Patient aware to call 911 or ER in case of psychiatric emergency.  Continue psychotherapy with the patient and to emphasize the importance of implementing psychotherapeutic techniques at home and work.     Jorene Minors, LCSW

## 2022-11-17 ENCOUNTER — Telehealth (INDEPENDENT_AMBULATORY_CARE_PROVIDER_SITE_OTHER): Payer: Commercial Managed Care - POS | Admitting: Clinical

## 2022-11-17 DIAGNOSIS — F314 Bipolar disorder, current episode depressed, severe, without psychotic features: Secondary | ICD-10-CM

## 2022-11-18 NOTE — Progress Notes (Signed)
Behavioral Health Services Integrated  Individual Therapy Note    Name:  Walter Villarreal   DOB: [birth year]  Medical Records: [id number]    Time in: 4:00PM   Time out:  4:557PM    Telemedicine: Yes    Duration of Session:  57 minutes    Goal(s) Addressed: Identification of cognitive distortions, Increased mindfulness Depression: acquiring relapse prevention skills, increasing interest in usual activities, and reducing negative automatic thoughts    Intervention: Cognitive-Behavioral Therapy strategies were implemented.  Psychodynamic therapy strategies were implemented.     NARRATIVE:     Patient's presenting problem: He continues to meet criteria for the Bipolar I, Current or Most Recent Depressed, Moderate-Severe, Unspecified, without mention of additional specifiers diagnoses. He continues to experience symptoms of: Major Depression:  MDE: 2+ wks, 5+:  usually depressed, loss of interest/pleasure, appetite +/-, sleep +/-, fatigue, worthlessness/guilt, concentration, withdrawal, and death/suicidal ideation. and appears to be unchanged. He noted varying psychosocial stressors. Reports increase in suicidal ideation, without intent. LCSW completed risk and safety assessment. PT denies intent to harm self. Pt does not appear to be harm to self or other at this time.      ASSESSMENT:     Completed Screening Measures:       11/17/2022     3:50 PM 11/09/2022     4:23 PM 10/19/2022     3:34 PM   PHQ2/PHQ9 SCORE    PHQ2 Score 6 6 6    PHQ9 Score 27 24 22          09/07/2022     3:34 PM   GAD-7 Scores   Feeling afraid as if something awful might happen 0   Being so restless that it is hard to sit still 0   Trouble relaxing 3   Worrying too much about different things 2   Not being able to stop or control worrying 2   Feeling nervous, anxious or on edge 2   GAD-7 Total Score 10   Becoming easily annoyed or irritable 1       Current Symptoms:  Depressed mood , Unable to enjoy activities, Sleep pattern disturbance , Loss of  interest, Racing thoughts , Increased irritability  , Avoidance , and Fatigue    Little interest or pleasure in doing things, Feeling down, depressed, or hopeless, Trouble falling, Staying asleep, Feeling tired or having little energy, Poor appetite/overeating , Feeling bad about yourself or that you are a failure or have let yourself or your family down, Trouble concentrating on things, such as reading the newspaper or watching television, Moving or speaking so slowly that other people could have noticed? , Being so fidgety or restless that you have been moving around a lot more than usual, and Thoughts that you would be better off dead or of hurting yourself in some way      Mental Status Exam:  General Appearance: neatly groomed, adequately nourished, and casually dressed  Behavior/Psychomotor: normal  and good eye contact  Mood: euthymic  Affect: congruent with mood  Speech: normal pitch and normal volume  Language: verbal expression is clear , has comprehensible ideas through verbal articulation, and ideas are conveyed and properly produced  Thought Form/Process: well organized, associations intact, and goal directed  Thought Content: absent of psychotic symptoms and absent of suicidal or homicidal ideation  Perception /Sensorium: clear, intact and makes sense of stimuli received  Judgment: good  Insight: good  Cognitive Functioning: cognition grossly intact, alert, oriented to person, oriented to place,  oriented to time, oriented to situation, memory intact, and attentive    Safety Risks:    Suicidal ideation? Plan: No  Intent: No  Access: No  Safety plan in place: Yes  Act on these thoughts? No   Homicidal ideation? No   Thoughts of harm to others? No   Thoughts of self injury? No   Substance abuse cravings? No     Risk factors include: Mixed affect episode (e.g. Bipolar)  Protective factors include: Identifies reasons for living Responsibility to family or others; living with family, Supportive social  network of family or friends, and Engaged in work or school    Patient was found to be at no significant risk for danger to self or danger to others and is safe for treatment in the outpatient level of care.     Response to intervention/skills and/or knowledge gained: Walter Villarreal appeared open to feedback and intervention.        TREATMENT PLAN:     Patient Stated Goals Addressed: Manage depression    [] Psychotherapy 1x weekly       [x] Psychotherapy 2x monthly   [] Psychotherapy 1x monthly  [] Plan to Discharge     []  No future appointments made by patient    Solution Focused reframing to elicit goal and future focused talk, Encouragement to maintain current positive and effective coping tools and strategies, CBT cognitive coping tools , Creating daily structure and routine plans, and Coaching and instructing in self care activities    He is applying the therapeutic interventions evidenced by: active and open discussion regarding skills used in session.    Progress Towards Goals: Stable/Improved; at goal    PLAN:     Follow up appointment: Date and Time:12/06/22  Patient aware to call 911 or ER in case of psychiatric emergency.  Continue psychotherapy with the patient and to emphasize the importance of implementing psychotherapeutic techniques at home and work.     Jorene Minors, LCSW

## 2022-11-22 ENCOUNTER — Encounter (INDEPENDENT_AMBULATORY_CARE_PROVIDER_SITE_OTHER): Payer: Self-pay | Admitting: Clinical

## 2022-12-02 ENCOUNTER — Telehealth (HOSPITAL_BASED_OUTPATIENT_CLINIC_OR_DEPARTMENT_OTHER): Payer: Commercial Managed Care - POS | Admitting: Psychiatry

## 2022-12-02 ENCOUNTER — Encounter (HOSPITAL_BASED_OUTPATIENT_CLINIC_OR_DEPARTMENT_OTHER): Payer: Self-pay | Admitting: Psychiatry

## 2022-12-02 ENCOUNTER — Encounter: Payer: Self-pay | Admitting: Family Medicine

## 2022-12-02 ENCOUNTER — Other Ambulatory Visit: Payer: Self-pay | Admitting: Family Medicine

## 2022-12-02 ENCOUNTER — Encounter (INDEPENDENT_AMBULATORY_CARE_PROVIDER_SITE_OTHER): Payer: Self-pay | Admitting: Clinical

## 2022-12-02 DIAGNOSIS — F4322 Adjustment disorder with anxiety: Secondary | ICD-10-CM

## 2022-12-02 DIAGNOSIS — F314 Bipolar disorder, current episode depressed, severe, without psychotic features: Secondary | ICD-10-CM

## 2022-12-02 DIAGNOSIS — F319 Bipolar disorder, unspecified: Secondary | ICD-10-CM

## 2022-12-02 MED ORDER — LITHIUM CARBONATE ER 300 MG PO TBCR
600.0000 mg | EXTENDED_RELEASE_TABLET | Freq: Every day | ORAL | 0 refills | Status: AC
Start: 2022-12-02 — End: ?

## 2022-12-02 MED ORDER — LITHIUM CARBONATE ER 450 MG PO TBCR
450.0000 mg | EXTENDED_RELEASE_TABLET | Freq: Every evening | ORAL | 0 refills | Status: AC
Start: 2022-12-02 — End: ?

## 2022-12-02 NOTE — Addendum Note (Signed)
Addended by: Justin Mend on: 12/02/2022 05:28 PM     Modules accepted: Orders

## 2022-12-02 NOTE — Progress Notes (Signed)
[facility] Behavioral Health Psychiatric Follow-Up    Date/Time:   12/02/2022  9:04 AM  Name:  Walter Villarreal, Walter Villarreal  MRN:   [id number]  Age:   26 y.o.  DOB:   05-22-1996  SEX:   male    Verbal consent has been obtained from the patient to conduct a video visit encounter.    CHIEF COMPLAINT:  depression, anxiety    HISTORY OF PRESENT ILLNESS:   Walter Villarreal, a 26 y.o. male with history of bipolar I d/o, presents for follow-up appointment.     Patient reports no major changes. Still struggling with depression. Hard to enjoy things. Notes occasional anxiety. Has been mostly depression. Gets about 7-8 hours of sleep, but wakes up feeling tired. Appetite has been okay. Notes increase in frequency of suicidal thoughts. Happens more throughout the day. Denies active SI/plan/intent. Waits for them to pass. No guns at home. Work has been fine. No major changes. Has been taking lithium 1050mg , however recently ran out. Still having headaches and shaking, which he attributes to this . Has hand shake, right hand more predominant then left. Does stay hydrated and drinks water. Nice to talk in therapy, however does not feel like accomplishing much. Would like to transition care back to PCP for ongoing medication management.      Medication Compliance/Substance Use: appropriate as assessed    Outpatient Medications Marked as Taking for the 12/02/22 encounter (Telemedicine Visit) with Ervin Knack, MD   Medication Sig Dispense Refill    cyanocobalamin 100 MCG tablet Take 10 tablets (1,000 mcg) by mouth daily      Docosahexaenoic Acid (Algal Omega-3 DHA) 200 MG Cap Take by mouth      lithium (LITHOBID) 450 MG CR tablet Take 1 tablet (450 mg) by mouth nightly with 300mg  tablets (1050mg  total nightly) 30 tablet 0    lithium 300 MG capsule Take 3 capsules (900 mg) by mouth daily 90 capsule 0    vitamin D (CHOLECALCIFEROL) 25 MCG (1000 UT) tablet Take 1 tablet (1,000 Units) by mouth daily         PSYCHIATRIC SPECIALTY & MENTAL  STATUS EXAMINATION:     Vital Signs  There were no vitals taken for this visit.   General Appearance  Neatly groomed, appropriately dressed and adequately nourished   Behavior/Movements  Appropriate/Cooperative and mild hand tremor   Speech/Language  Normal Rate, Rhythm, Volume, and Tone; Appropriate Language Use   Thought Process  Logical, Linear, Goal Directed   Thought Content   Chronic passive SI; no plan/intent   Perceptions  No evidence of hallucinosis   Insight/Judgment   Good   Cognition  Sensorium clear, adequate fund of knowledge   Mood  Depressed, Anxious   Affect  Limited / Constricted     SCREENS/LABS/IMAGING:   PHQ-9  Little interest or pleasure in doing things: 3 (12/02/2022  8:51 AM)  Feeling down, depressed, or hopeless: 3 (12/02/2022  8:51 AM)  Trouble falling or staying asleep, or sleeping too much: 2 (12/02/2022  8:51 AM)  Feeling tired or having little energy: 2 (12/02/2022  8:51 AM)  Poor appetite or overeating: 3 (12/02/2022  8:51 AM)  Feeling bad about yourself - or that you are a failure or have let yourself or your family down: 3 (12/02/2022  8:51 AM)  Trouble concentrating on things, such as reading the newspaper or watching television: 2 (12/02/2022  8:51 AM)  Moving or speaking so slowly that other people could have noticed. Or the  opposite - being so fidgety or restless that you have been moving around a lot more than usual: 2 (12/02/2022  8:51 AM)  Thoughts that you would be better off dead, or of hurting yourself in some way: 3 (12/02/2022  8:51 AM)  PHQ Total Score: (!) 23 (12/02/2022  8:51 AM)    Please Note: The PHQ-9 is a screening tool to aid in diagnosis and tracking of depressive disorders, and is not validated to independently determine level of risk.     ASSESSMENT    Walter Villarreal is a 26 y.o. male with past psychiatric history of bipolar disorder presents with ongoing difficulties with symptoms of anxiety and depression. Patient describes two distinct prior manic  episodes. He has been maintained on lithium which has helped in preventing further episodes. However, he continues to suffer from significant depressive symptoms which has impacted his enjoyment in life, and has led to chronic passive death wish and feelings of hopelessness. He would like to transition care back to PCP, Dr. Trina Ao.  He has good support system in place, is active in therapy, and remains future oriented.     Diagnoses  Encounter Diagnoses   Name Primary?    Bipolar 1 disorder, depressed, severe Yes    Adjustment disorder with anxiety      Risk assessment:  -The patient does not appear to be at imminent risk of self-harm or violence, though any future actions are unpredictable.      TREATMENT PLAN:   Treatment options and alternatives reviewed with patient, along with detailed discussion of medication(s) and side effects, and they concur with following plan:     Goals:   Patient will take prescribed medication responsibly at times ordered by the physician.  Patient will report the effectiveness of medications and any side effects that develop.  Patient will report evidence of the degree of improvement in symptoms.  Patient will attend follow-up appointments as scheduled by the physician.    Problem(s): Bipolar Depression, Anxiety   Goal: Improvement in symptoms as evidenced by PHQ-9 and self-report   Status Poorly controlled/worse   Plan: Patient stated preference to transition care to PCP. Reports having additional lithium at home. Advised to contact PCP Dr. Trina Ao for ongoing management of medication. Patient stated agreement and plans to follow up this week. Did not consent to direct care coordination with PCP for transition  Advised may consider reduced lithium back down from 1050mg  to 900mg  given reported side effects of headaches and shaking (most recent lithium level 0.849), however would not recommend stopping medication as can be protective in modulating depression, manic, as well as  suicidal symptoms. Goal lithium level would be 0.6 or greater.  Discussed pros/cons of doing genetic testing at a future date given history of prior difficulties with nonresponse to medications and medication side effects. Elected to defer at this time  Recommended following up with sleep medicine for in house sleep study to assess for possible contributing factors for depression, low energy, and history of nonrestful sleep. Has had prior home sleep study   Provided information for [facility] outpatient ECT as a potential future treatment option  Reviewed safety planning. Educated on national suicide hotline (988). No acute safety concerns at present.      Risk/benefits/alternatives/side effects of medications and treatment modalities discussed. Patient gives informed consent.    THERAPY:   N/A    DISPOSITION & FOLLOW UP:   Follow up: transfer care to PCP Dr. Trina Ao.  Patient has number to [place]'s 24/7 central access line, and is aware to call 911 or ER in case of psychiatric emergency.      Ervin Knack, MD

## 2022-12-03 ENCOUNTER — Telehealth (INDEPENDENT_AMBULATORY_CARE_PROVIDER_SITE_OTHER): Payer: Commercial Managed Care - POS | Admitting: Clinical

## 2022-12-06 ENCOUNTER — Telehealth (INDEPENDENT_AMBULATORY_CARE_PROVIDER_SITE_OTHER): Payer: Commercial Managed Care - POS | Admitting: Clinical

## 2022-12-06 DIAGNOSIS — F319 Bipolar disorder, unspecified: Secondary | ICD-10-CM

## 2022-12-07 ENCOUNTER — Encounter (INDEPENDENT_AMBULATORY_CARE_PROVIDER_SITE_OTHER): Payer: Self-pay | Admitting: Clinical

## 2022-12-14 NOTE — Progress Notes (Signed)
Behavioral Health Services Integrated  Individual Therapy Note    Name:  Walter Villarreal   DOB: April 08, 1996  Medical Records: [id number]    Time in: 1600   Time out:  1653    Telemedicine: Yes    Duration of Session:  53 minutes    Goal(s) Addressed: Identification of cognitive distortions, Increased mindfulness Depression: acquiring relapse prevention skills and reducing negative automatic thoughts    Intervention: Cognitive-Behavioral Therapy strategies were implemented.     NARRATIVE:     Patient's presenting problem: He continues to meet criteria for the Bipolar I, Current or Most Recent Unspecified diagnoses. He continues to experience symptoms of: Depressive Disorder: usually depressed, loss of interest/pleasure, weight +/- 5% month, appetite +/-, fatigue, worthlessness/guilt, withdrawal, and death/suicidal ideation. and appears to be rapidly worsening. He noted varying psychosocial stressors. Continues to endorse worsen in depression and increase in SI. States he decided to stop working with psychiatrist and will be working with [facility] while searching for a new psychiatrist. Explored and discussed pt's increase in SI. Pt acknowledge plan but stays he has no intent to carry out plan. Discussed safety measure and support system. Pt does not appear to be a threat to self or other at this time. LCSW validated pt's frustration regarding lack of progress in medication treatment.        ASSESSMENT:     Completed Screening Measures:       12/06/2022     2:46 PM 12/02/2022     8:51 AM 11/17/2022     3:50 PM   PHQ2/PHQ9 SCORE    PHQ2 Score 6 6 6    PHQ9 Score 25 23 27          12/06/2022     2:46 PM 09/07/2022     3:34 PM   GAD-7 Scores   Feeling afraid as if something awful might happen 3 0   Being so restless that it is hard to sit still 0 0   Trouble relaxing 3 3   Worrying too much about different things 3 2   Not being able to stop or control worrying 3 2   Feeling nervous, anxious or on edge 2 2   GAD-7 Total Score  17 10   Becoming easily annoyed or irritable 3 1       Current Symptoms:   Little interest or pleasure in doing things, Feeling down, depressed, or hopeless, Trouble falling, Staying asleep, Feeling tired or having little energy, Poor appetite/overeating , Feeling bad about yourself or that you are a failure or have let yourself or your family down, Trouble concentrating on things, such as reading the newspaper or watching television, Moving or speaking so slowly that other people could have noticed? , Being so fidgety or restless that you have been moving around a lot more than usual, and Thoughts that you would be better off dead or of hurting yourself in some way    Feeling nervous, anxious or on edge, Not being able to stop or control worrying , Worrying too much about different things, Trouble relaxing, Becoming easily annoyed or irritable, and Feeling afraid as if something awful might happen    Mental Status Exam:  General Appearance: neatly groomed, adequately nourished, and casually dressed  Behavior/Psychomotor: normal  and good eye contact  Mood: euthymic  Affect: congruent with mood  Speech: normal pitch and normal volume  Language: verbal expression is clear , has comprehensible ideas through verbal articulation, and ideas are conveyed and properly  produced  Thought Form/Process: well organized, associations intact, and goal directed  Thought Content: absent of psychotic symptoms and absent of suicidal or homicidal ideation  Perception /Sensorium: clear, intact and makes sense of stimuli received  Judgment: good  Insight: good  Cognitive Functioning: cognition grossly intact, alert, oriented to person, oriented to place, oriented to time, oriented to situation, memory intact, and attentive    Safety Risks:    Suicidal ideation? No   Homicidal ideation? No   Thoughts of harm to others? No   Thoughts of self injury? No   Substance abuse cravings? No     Risk factors include: Mixed affect episode (e.g.  Bipolar)  Protective factors include: Identifies reasons for living Responsibility to family or others; living with family, Supportive social network of family or friends, and Engaged in work or school    Patient was found to be at no significant risk for danger to self or danger to others and is safe for treatment in the outpatient level of care.     Response to intervention/skills and/or knowledge gained: Sherron AlesLachlan appeared open to feedback and intervention.        TREATMENT PLAN:     Patient Stated Goals Addressed: Manage depression    [] Psychotherapy 1x weekly       [x] Psychotherapy 2x monthly   [] Psychotherapy 1x monthly  [] Plan to Discharge     []  No future appointments made by patient    Eliciting positive experiences and exceptions to problematic symptoms, CBT body relaxation/regulation tools, and Stress Management tools    He is applying the therapeutic interventions evidenced by: active and open discussion regarding skills used in session.    Progress Towards Goals: Stable/Improved; below goal    PLAN:     Follow up appointment: Date and Time:01/04/23  Patient aware to call 911 or ER in case of psychiatric emergency.  Continue psychotherapy with the patient and to emphasize the importance of implementing psychotherapeutic techniques at home and work.     Jorene Minorshristina H Jeffie Widdowson, LCSW

## 2023-01-04 ENCOUNTER — Telehealth (INDEPENDENT_AMBULATORY_CARE_PROVIDER_SITE_OTHER): Payer: Commercial Managed Care - POS | Admitting: Clinical

## 2023-03-16 ENCOUNTER — Telehealth: Payer: Self-pay | Admitting: Family Medicine

## 2023-03-16 ENCOUNTER — Encounter: Payer: Self-pay | Admitting: Family Medicine

## 2023-03-16 MED ORDER — LURASIDONE HCL 20 MG PO TABS
20.0000 mg | ORAL_TABLET | Freq: Every day | ORAL | 0 refills | Status: AC
Start: 2023-03-16 — End: ?

## 2023-03-16 NOTE — Telephone Encounter (Signed)
Lvm for call back.

## 2023-03-16 NOTE — Addendum Note (Signed)
Addended byIsac Caddy on: 03/16/2023 01:15 PM     Modules accepted: Orders

## 2023-03-16 NOTE — Telephone Encounter (Signed)
See other telephone encounter dated 03/16/23

## 2023-03-16 NOTE — Telephone Encounter (Signed)
Patient left voicemail regarding his medication prescribed by his psychiatrist. He has only a few pills left and his psychiatrist has quit.  Please call  patient to verify medication and discuss with Dr Jessica Priest.  PS

## 2023-03-16 NOTE — Telephone Encounter (Signed)
Patient called back and left a voicemail.  The medication he needs is lurasidone, 20 mg. He will be out tomorrow.  Please call patient with medication refill status. Confirm pharmacy as well. PS

## 2023-03-16 NOTE — Telephone Encounter (Signed)
FYI,    I spoke to the patient and offered to give him the number for [facility]. Patient states that he was in touch with them before and it was not a good fit. I suggested that he stay in touch with you via MyChart and to reach out to [place] if he has not found a new psychiatrist within 30 days of his prescription running out. I also suggested contacting the [facility] in Psychiatry. Patient states that he was not able to find a qualified psychiatrist at that practice. Patient states that there are not enough doctors in the field and who also work with his condition.     No further action needed at this time.

## 2023-04-23 ENCOUNTER — Encounter (INDEPENDENT_AMBULATORY_CARE_PROVIDER_SITE_OTHER): Payer: Self-pay | Admitting: Clinical

## 2023-04-23 NOTE — Progress Notes (Signed)
[facility] Services Discharge Summary    Walter Villarreal  Date of Discharge: 04/22/23    Date of Initial Evaluation: 09/07/22    Provider: Rosina Lowenstein, LCSW, [facility]-C    Initial PHQ-9 score: 23  Last PHQ-9 score: 25     Was the patient referred to other services? no     Reason for Discharge:   The patient is discharged due to: Patient decided to discontinue services with [facility]. Patient was able to find a long term therapist in the community.      Overall Course of Treatment:  Presenting issue(s) addressed: Bipolar I, Current or Most Recent Unspecified .     Main areas of focus covered while in treatment: Manage depression    Individual's level of functioning or functional limitations: Fair    Progress noted in treatment: Pt appeared to be in action stage of change    Patient Stated Goals Addressed: Manage depression    Progress Towards Goals:        []  Noncompliant: Explain:        []  Minimal: Explain:         [x]  Engaged: Explain: Pt attended all sessions on time, engaged in conversation, and applied skills discussed in therapy outside of sessions.     RISK ASSESSMENT:   At time of assessment, patient does report SI/HI   Patient does report history of SI/HI   Patient reports family history of suicide     Patient denies access to firearms/means of:    Patient denies history of self-harm or injurious behavior      Risk factors: Hopelessness , Major depressive episode, Mixed affect episode (e.g. Bipolar), and Perceived burden on family or others  Protective Factors: Identifies reasons for living Responsibility to family or others; living with family, Supportive social network of family or friends, and Engaged in work or school    Patient was found to be at no significant risk for danger to self or danger to others and is safe for treatment in the outpatient level of care.    Discharge Plan/Disposition:    Patient was absent in the Discharge Planning Process:   *If patient is absent for discharge the below  is not applicable.    Recommendations upon Discharge:  Medication Management   Therapy (Individual)    Plan:  1. Continue self-care activities and maintain your personal routine.   2. Avoid the use of substances to maintain progress achieved during treatment.   3. Take psychotropic medications as prescribed and keep scheduled follow-up appointments as recommended by physician.     Currently prescribed medications are:  Current Outpatient Medications on File Prior to Visit   Medication Sig Dispense Refill    clindamycin (Clindagel) 1 % gel Apply topically 2 (two) times daily 60 g 2    cyanocobalamin 100 MCG tablet Take 10 tablets (1,000 mcg) by mouth daily      Docosahexaenoic Acid (Algal Omega-3 DHA) 200 MG Cap Take by mouth      lithium (LITHOBID) 300 MG CR tablet Take 2 tablets (600 mg) by mouth daily 180 tablet 0    lithium (LITHOBID) 450 MG CR tablet Take 1 tablet (450 mg) by mouth nightly with 2x 300mg  tablets (1050mg  total nightly) 90 tablet 0    lurasidone (LATUDA) 20 MG Tab Take 1 tablet (20 mg) by mouth Daily after dinner 90 tablet 0    vitamin D (CHOLECALCIFEROL) 25 MCG (1000 UT) tablet Take 1 tablet (1,000 Units) by mouth  daily       No current facility-administered medications on file prior to visit.       4. Monitor mood and behavior closely for any changes and for any emerging or worsening of suicidal thoughts. Contact 911 or go to their nearest Emergency Room.    The patient demonstrates capacity and understands and agrees with this plan of care. The patient engaged in discussing their readiness for change and developed discharge plans in collaboration with their treatment team. An opportunity for questions and clarification was given.     Future Services and Appointments:  Future Appointments   Date Time Provider Department Center   09/06/2023  8:00 AM Florentina Jenny, MA [facility] [place]   09/13/2023  8:30 AM Ambalam, Siva, DO [facility] [place]       This discharge summary was prepared 04/23/2023,  by Jorene Minors, LCSW.

## 2023-08-01 ENCOUNTER — Telehealth: Payer: Self-pay | Admitting: Family Medicine

## 2023-08-01 NOTE — Telephone Encounter (Signed)
Spoke to [facility] from Dr. Luanna Salk office and informed her that the patient reached out to [place] about getting his Lithium refilled as he will be moving out of the country. Dorathy Daft put in a message to MD about patient needing refill for lithium 300 MG ER tablet and stated that they haven't seen the patient since June. Patient will need to be seen for a refill.

## 2023-08-01 NOTE — Telephone Encounter (Signed)
Unsure who is refilling medications as my last Rx was 11/2022    Please call pharmacy to see who is managing medications for patient.

## 2023-08-01 NOTE — Telephone Encounter (Signed)
Patient requested refill for lithium (LITHOBID) 300 MG CR tablet. Check with Dr Trina Ao since patient would like to get as much of this prescription as he can since he is moving out of the country in [place]. Update patient on quantity Dr Trina Ao will be prescribing.  [facility] to [facility] [place] at [facility]. (new pharmacy).  PS

## 2023-08-02 NOTE — Telephone Encounter (Signed)
If patient wants to get as much medication as possible before leaving country, it should come from psych. Nothing more to do at this point.

## 2023-09-06 ENCOUNTER — Other Ambulatory Visit: Payer: Commercial Managed Care - POS

## 2023-09-13 ENCOUNTER — Encounter: Payer: Commercial Managed Care - POS | Admitting: Family Medicine
